# Patient Record
Sex: Female | Born: 1935 | Race: White | Hispanic: No | Smoking: Never smoker
Health system: Southern US, Community
[De-identification: ages and names within clinical notes are randomized; demographics above are authoritative.]

## PROBLEM LIST (undated history)

## (undated) DIAGNOSIS — E079 Disorder of thyroid, unspecified: Secondary | ICD-10-CM

## (undated) DIAGNOSIS — F329 Major depressive disorder, single episode, unspecified: Secondary | ICD-10-CM

## (undated) DIAGNOSIS — I509 Heart failure, unspecified: Secondary | ICD-10-CM

## (undated) DIAGNOSIS — I10 Essential (primary) hypertension: Secondary | ICD-10-CM

## (undated) DIAGNOSIS — E139 Other specified diabetes mellitus without complications: Secondary | ICD-10-CM

## (undated) DIAGNOSIS — E785 Hyperlipidemia, unspecified: Secondary | ICD-10-CM

## (undated) DIAGNOSIS — J302 Other seasonal allergic rhinitis: Secondary | ICD-10-CM

## (undated) DIAGNOSIS — K219 Gastro-esophageal reflux disease without esophagitis: Secondary | ICD-10-CM

## (undated) DIAGNOSIS — F32A Depression, unspecified: Secondary | ICD-10-CM

## (undated) DIAGNOSIS — I4891 Unspecified atrial fibrillation: Secondary | ICD-10-CM

## (undated) HISTORY — PX: BACK SURGERY: SHX140

## (undated) HISTORY — PX: KNEE ARTHROCENTESIS: SUR44

---

## 1998-10-25 ENCOUNTER — Ambulatory Visit (HOSPITAL_COMMUNITY): Admission: RE | Admit: 1998-10-25 | Discharge: 1998-10-25 | Payer: Self-pay | Admitting: Neurosurgery

## 1998-10-25 ENCOUNTER — Encounter: Payer: Self-pay | Admitting: Neurosurgery

## 1998-11-06 ENCOUNTER — Encounter: Payer: Self-pay | Admitting: Neurosurgery

## 1998-11-09 ENCOUNTER — Inpatient Hospital Stay (HOSPITAL_COMMUNITY): Admission: RE | Admit: 1998-11-09 | Discharge: 1998-11-10 | Payer: Self-pay | Admitting: Neurosurgery

## 1998-11-09 ENCOUNTER — Encounter: Payer: Self-pay | Admitting: Neurosurgery

## 2003-10-17 ENCOUNTER — Encounter: Admission: RE | Admit: 2003-10-17 | Discharge: 2003-10-17 | Payer: Self-pay

## 2004-09-18 ENCOUNTER — Encounter: Admission: RE | Admit: 2004-09-18 | Discharge: 2004-09-18 | Payer: Self-pay

## 2004-09-20 ENCOUNTER — Ambulatory Visit (HOSPITAL_COMMUNITY): Admission: RE | Admit: 2004-09-20 | Discharge: 2004-09-20 | Payer: Self-pay

## 2007-11-09 ENCOUNTER — Encounter: Admission: RE | Admit: 2007-11-09 | Discharge: 2007-11-09 | Payer: Self-pay | Admitting: Unknown Physician Specialty

## 2008-02-25 ENCOUNTER — Encounter: Admission: RE | Admit: 2008-02-25 | Discharge: 2008-02-25 | Payer: Self-pay | Admitting: Unknown Physician Specialty

## 2009-12-20 ENCOUNTER — Encounter: Admission: RE | Admit: 2009-12-20 | Discharge: 2009-12-20 | Payer: Self-pay | Admitting: Unknown Physician Specialty

## 2009-12-29 ENCOUNTER — Ambulatory Visit: Payer: Self-pay | Admitting: Family Medicine

## 2009-12-29 DIAGNOSIS — E039 Hypothyroidism, unspecified: Secondary | ICD-10-CM | POA: Insufficient documentation

## 2009-12-29 DIAGNOSIS — M545 Low back pain, unspecified: Secondary | ICD-10-CM | POA: Insufficient documentation

## 2009-12-29 DIAGNOSIS — E785 Hyperlipidemia, unspecified: Secondary | ICD-10-CM

## 2009-12-29 DIAGNOSIS — E119 Type 2 diabetes mellitus without complications: Secondary | ICD-10-CM | POA: Insufficient documentation

## 2009-12-29 DIAGNOSIS — S336XXA Sprain of sacroiliac joint, initial encounter: Secondary | ICD-10-CM | POA: Insufficient documentation

## 2009-12-29 DIAGNOSIS — I10 Essential (primary) hypertension: Secondary | ICD-10-CM

## 2009-12-29 DIAGNOSIS — M76899 Other specified enthesopathies of unspecified lower limb, excluding foot: Secondary | ICD-10-CM

## 2010-01-01 ENCOUNTER — Telehealth (INDEPENDENT_AMBULATORY_CARE_PROVIDER_SITE_OTHER): Payer: Self-pay | Admitting: *Deleted

## 2010-04-30 NOTE — Progress Notes (Signed)
  Phone Note Outgoing Call Call back at [facility] Phone [phone]   Call placed by: Lajean Saver RN,  January 01, 2010 10:09 AM Call placed to: Patient Action Taken: Phone Call Completed Summary of Call: Call back: PAtient c/o pain still present in her hip. I encouraged her to ear before taking the pain medication and to do the exercises provided. If hip pain is still present in 2 weeks to call for a referral to sports med. Survey done.

## 2010-04-30 NOTE — Assessment & Plan Note (Signed)
Summary: L hip pain x 1 wk rm 4   Vital Signs:  Patient Profile:   77 Years Old Female CC:      L Hip Pain x 1 wk Height:     61 inches O2 Sat:      99 % O2 treatment:    Room Air Temp:     97.8 degrees F oral Pulse rate:   81 / minute Pulse rhythm:   regular Resp:     16 per minute BP sitting:   144 / 79  (left arm) Cuff size:   regular  Vitals Entered By: Areta Haber CMA (December 29, 2009 11:47 AM)                  Current Allergies (reviewed today): ! CODEINE ! PENICILLIN     History of Present Illness Chief Complaint: L Hip Pain x 1 wk History of Present Illness:  Subjective:  Patient states that she fell backwards onto her back about 1.5 weeks ago.  She had no immediate pain, but over the past several days has had increasing pain in her left low back.  The pain does not radiate.  She has pain walking up/down stairs and standing from a chair, pain with walking, and supine on her left side.  No bowel or bladder dysfunction  Current Problems: TROCHANTERIC BURSITIS, LEFT (ICD-726.5) SACROILIAC STRAIN (ICD-846.9) LOW BACK PAIN, ACUTE (ICD-724.2) HYPOTHYROIDISM (ICD-244.9) HYPERTENSION (ICD-401.9) HYPERLIPIDEMIA (ICD-272.4) DIABETES MELLITUS, TYPE II (ICD-250.00)   Current Meds SIMVASTATIN 20 MG TABS (SIMVASTATIN) 1 tab by mouth once daily NARATRIPTAN HCL 2.5 MG TABS (NARATRIPTAN HCL) 2 tabs by mouth once daily NABUMETONE 500 MG TABS (NABUMETONE) 2 tabs by mouth once daily LISINOPRIL 40 MG TABS (LISINOPRIL) 1 tab by mouth once daily PRILOSEC 40 MG CPDR (OMEPRAZOLE) 1 tab by mouth once daily LEVOTHROID 100 MCG TABS (LEVOTHYROXINE SODIUM) 1 tab by mouth once daily HYDROCHLOROTHIAZIDE 25 MG TABS (HYDROCHLOROTHIAZIDE) 1 tab by mouth once daily GLIPIZIDE 10 MG XR24H-TAB (GLIPIZIDE) 1 tab by mouth once daily CENTRUM SILVER ULTRA WOMENS  TABS (MULTIPLE VITAMINS-MINERALS) 1 tab by mouth once daily CHELATED POTASSIUM 99 MG TABS (POTASSIUM) 2 tabs by mouth once  daily TYLENOL 325 MG TABS (ACETAMINOPHEN) as needed VOLTAREN 1 % GEL (DICLOFENAC SODIUM) Apply two gm  to affected areas two times a day to three times a day LORTAB 5 5-500 MG TABS (HYDROCODONE-ACETAMINOPHEN) One-half to one tab by mouth hs as needed pain  REVIEW OF SYSTEMS Constitutional Symptoms      Denies fever, chills, night sweats, weight loss, weight gain, and fatigue.  Eyes       Denies change in vision, eye pain, eye discharge, glasses, contact lenses, and eye surgery. Ear/Nose/Throat/Mouth       Denies hearing loss/aids, change in hearing, ear pain, ear discharge, dizziness, frequent runny nose, frequent nose bleeds, sinus problems, sore throat, hoarseness, and tooth pain or bleeding.  Respiratory       Denies dry cough, productive cough, wheezing, shortness of breath, asthma, bronchitis, and emphysema/COPD.  Cardiovascular       Denies murmurs, chest pain, and tires easily with exhertion.    Gastrointestinal       Denies stomach pain, nausea/vomiting, diarrhea, constipation, blood in bowel movements, and indigestion. Genitourniary       Denies painful urination, kidney stones, and loss of urinary control. Neurological       Denies paralysis, seizures, and fainting/blackouts. Musculoskeletal       Complains of decreased range  of motion.      Denies muscle pain, joint pain, joint stiffness, redness, swelling, muscle weakness, and gout.      Comments: L Hip Pain x 1 wk Skin       Denies bruising, unusual mles/lumps or sores, and hair/skin or nail changes.  Psych       Denies mood changes, temper/anger issues, anxiety/stress, speech problems, depression, and sleep problems. Other Comments: Pt states she think her BG was low on 12/19/09, fell backwards hurting her L hip x 1 wk. Pt states she was seen by PCP for R arm pain from the fall but not the hip.   Past History:  Past Medical History: Diabetes mellitus, type II Hyperlipidemia Hypertension Hypothyroidism  Past  Surgical History: Back Knee - R  Social History: Never Smoked Alcohol use-no Drug use-no Regular exercise-no Smoking Status:  never Drug Use:  no Does Patient Exercise:  no   Objective:  No acute distress  Back:  Patient moves slowly.  Tenderness over left SI joint.  Negative straight leg raise and sitting knee extension tests. Left Hip:  Good range of motion.  Tender over the left greater trochanter.  Palpation there with resisted lateral  abduction recreates some of her pain. Neuro:  Distal sensation intact.  Patellar reflexes present.  LUMBAR SPINE - COMPLETE 4+ VIEW   Comparison: Lumbar MRI dated 10/17/2003   Findings: No acute fracture identified.  Posterior element fusion hardware evident at L4-5 and L5-S1.  There is severe disc space narrowing at L3-4 with vacuum disc.  Significant leftward convex scoliosis also present.  No spondylolisthesis appreciated in the lateral view.   IMPRESSION: No acute findings.  Significant spondylosis with evidence of prior posterior element fusion at L4-5 and L5-S1. Assessment New Problems: TROCHANTERIC BURSITIS, LEFT (ICD-726.5) SACROILIAC STRAIN (ICD-846.9) LOW BACK PAIN, ACUTE (ICD-724.2) HYPOTHYROIDISM (ICD-244.9) HYPERTENSION (ICD-401.9) HYPERLIPIDEMIA (ICD-272.4) DIABETES MELLITUS, TYPE II (ICD-250.00)   Plan New Medications/Changes: LORTAB 5 5-500 MG TABS (HYDROCODONE-ACETAMINOPHEN) One-half to one tab by mouth hs as needed pain  #10 (ten) x 0, 12/29/2009, Donna Christen MD VOLTAREN 1 % GEL (DICLOFENAC SODIUM) Apply two gm  to affected areas two times a day to three times a day  #100gm x 1, 12/29/2009, Donna Christen MD  New Orders: T-DG Lumbar Spine Complete [72110] New Patient Level III [99203] Planning Comments:   Begin applying ice pack several times daily.  Apply Voltaren gel to left SI joint and left greater trochanter.  Analgesic for bedtime. Begin range of motion exercises ([employer] information and  instruction patient handout given)  Follow-up with [facility] if not improving 2 to 3 weeks.   The patient and/or caregiver has been counseled thoroughly with regard to medications prescribed including dosage, schedule, interactions, rationale for use, and possible side effects and they verbalize understanding.  Diagnoses and expected course of recovery discussed and will return if not improved as expected or if the condition worsens. Patient and/or caregiver verbalized understanding.  Prescriptions: LORTAB 5 5-500 MG TABS (HYDROCODONE-ACETAMINOPHEN) One-half to one tab by mouth hs as needed pain  #10 (ten) x 0   Entered and Authorized by:   Donna Christen MD   Signed by:   Donna Christen MD on 12/29/2009   Method used:   Print then Give to Patient   RxID:   [id number] VOLTAREN 1 % GEL (DICLOFENAC SODIUM) Apply two gm  to affected areas two times a day to three times a day  #100gm x 1  Entered and Authorized by:   Donna Christen MD   Signed by:   Donna Christen MD on 12/29/2009   Method used:   Print then Give to Patient   RxID:   [id number]   Orders Added: 1)  T-DG Lumbar Spine Complete [72110] 2)  New Patient Level III [84132]

## 2012-06-27 ENCOUNTER — Encounter: Payer: Self-pay | Admitting: Emergency Medicine

## 2012-06-27 ENCOUNTER — Emergency Department (INDEPENDENT_AMBULATORY_CARE_PROVIDER_SITE_OTHER): Payer: Medicare Other

## 2012-06-27 ENCOUNTER — Emergency Department
Admission: EM | Admit: 2012-06-27 | Discharge: 2012-06-27 | Disposition: A | Discharge status: Home / Self Care | Payer: Medicare Other | Source: Home / Self Care | Attending: Family Medicine | Admitting: Family Medicine

## 2012-06-27 DIAGNOSIS — R079 Chest pain, unspecified: Secondary | ICD-10-CM

## 2012-06-27 DIAGNOSIS — R05 Cough: Secondary | ICD-10-CM

## 2012-06-27 DIAGNOSIS — J209 Acute bronchitis, unspecified: Secondary | ICD-10-CM

## 2012-06-27 HISTORY — DX: Other specified diabetes mellitus without complications: E13.9

## 2012-06-27 HISTORY — DX: Other seasonal allergic rhinitis: J30.2

## 2012-06-27 HISTORY — DX: Hyperlipidemia, unspecified: E78.5

## 2012-06-27 HISTORY — DX: Essential (primary) hypertension: I10

## 2012-06-27 HISTORY — DX: Gastro-esophageal reflux disease without esophagitis: K21.9

## 2012-06-27 HISTORY — DX: Depression, unspecified: F32.A

## 2012-06-27 HISTORY — DX: Major depressive disorder, single episode, unspecified: F32.9

## 2012-06-27 MED ORDER — BENZONATATE 200 MG PO CAPS: 200.0000 mg | ORAL_CAPSULE | Freq: Every day | ORAL | Status: DC

## 2012-06-27 MED ORDER — DOXYCYCLINE HYCLATE 100 MG PO CAPS: 100.0000 mg | ORAL_CAPSULE | Freq: Two times a day (BID) | ORAL | Status: DC

## 2012-06-27 NOTE — ED Provider Notes (Signed)
History     CSN: [id number]  Arrival date & time 06/27/12  1230   First MD Initiated Contact with Patient 06/27/12 1321      Chief Complaint  Patient presents with  . Nasal Congestion  . Cough  . Hoarse  . Sore Throat      HPI Comments: Patient developed mild cold like symptoms about two weeks ago with sore throat, sneezing, sinus congestion and cough.  The cough is productive and has gradually become worse.  She frequently coughs until she gags. She has a past history of pneumonia, but gets pneumococcal vaccine every 5 years.  The history is provided by the patient and a relative.    Past Medical History  Diagnosis Date  . Hypertension   . Seasonal allergies   . Depression   . Hyperlipidemia   . GERD (gastroesophageal reflux disease)   . Diabetes 1.5, managed as type 2     Past Surgical History  Procedure Laterality Date  . Back surgery    . Knee arthrocentesis      History reviewed. No pertinent family history.  History  Substance Use Topics  . Smoking status: Not on file  . Smokeless tobacco: Not on file  . Alcohol Use: Not on file    OB History   Grav Para Term Preterm Abortions TAB SAB Ect Mult Living                  Review of Systems + sore throat, resolved + cough + pleuritic pain + wheezing + nasal congestion + post-nasal drainage ? sinus pain/pressure No itchy/red eyes No earache No hemoptysis + SOB No fever/chills No nausea No vomiting No abdominal pain No diarrhea No urinary symptoms No skin rashes + fatigue No myalgias No headache Used OTC meds without relief  Allergies  Codeine and Penicillins  Home Medications   Current Outpatient Rx  Name  Route  Sig  Dispense  Refill  . glipiZIDE (GLUCOTROL XL) 10 MG 24 hr tablet   Oral   Take 20 mg by mouth daily.         . hydrochlorothiazide (HYDRODIURIL) 25 MG tablet   Oral   Take 25 mg by mouth daily.         Marland Kitchen levothyroxine (SYNTHROID, LEVOTHROID) 75 MCG tablet  Oral   Take 75 mcg by mouth daily.         Marland Kitchen lisinopril (PRINIVIL,ZESTRIL) 40 MG tablet   Oral   Take 40 mg by mouth daily.         . nabumetone (RELAFEN) 500 MG tablet   Oral   Take 500 mg by mouth 2 (two) times daily.         . nortriptyline (PAMELOR) 25 MG capsule   Oral   Take 25 mg by mouth 2 (two) times daily.         Marland Kitchen omeprazole (PRILOSEC) 40 MG capsule   Oral   Take 40 mg by mouth daily.         . simvastatin (ZOCOR) 20 MG tablet   Oral   Take 20 mg by mouth every evening.         . benzonatate (TESSALON) 200 MG capsule   Oral   Take 1 capsule (200 mg total) by mouth at bedtime.   12 capsule   0   . doxycycline (VIBRAMYCIN) 100 MG capsule   Oral   Take 1 capsule (100 mg total) by mouth 2 (two) times daily.  20 capsule   0     BP 148/82  Pulse 78  Temp(Src) 98.1 F (36.7 C) (Oral)  Ht 5\' 1"  (1.549 m)  Wt 165 lb (74.844 kg)  BMI 31.19 kg/m2  SpO2 96%  Physical Exam Nursing notes and Vital Signs reviewed. Appearance:  Patient appears stated age, and in no acute distress.  She is alert and oriented. Eyes:  Pupils are equal, round, and reactive to light and accomodation.  Extraocular movement is intact.  Conjunctivae are not inflamed  Ears:  Canals normal.  Tympanic membranes normal.  Nose:  Mildly congested turbinates.  No sinus tenderness.   Pharynx:  Normal Neck:  Supple.  No adenopathy Lungs:  Clear to auscultation.  Breath sounds are equal.  Chest:  Mild tenderness over left lateral/inferior chest. Heart:  Regular rate and rhythm without murmurs, rubs, or gallops.  Abdomen:  Nontender without masses or hepatosplenomegaly.  Bowel sounds are present.  No CVA or flank tenderness.  Extremities:  No edema.  No calf tenderness Skin:  No rash present.   ED Course  Procedures  none   Dg Ribs Unilateral W/chest Left  06/27/2012  *RADIOLOGY REPORT*  Clinical Data: Left lateral rib pain after 2 weeks of coughing.  LEFT RIBS AND CHEST - 3+  VIEW  Comparison: 02/25/2008.  Findings: Normal sized heart.  Clear lungs.  Stable mild central peribronchial thickening.  No rib fracture or pneumothorax seen.  IMPRESSION:  1.  No rib fracture or pneumothorax. 2.  Stable mild chronic bronchitic changes.   Original Report Authenticated By: Beckie Salts, M.D.      1. Acute bronchitis       MDM  Begin doxycycline. Prescription written for Benzonatate [facility]) to take at bedtime for night-time cough.  Take plain Mucinex (guaifenesin) twice daily for cough and congestion.  Increase fluid intake, rest. Stop all antihistamines for now, and other non-prescription cough/cold preparations. Follow-up with family doctor if not improving 7 to 10 days.        Lattie Haw, MD 06/28/12 [phone]

## 2012-06-27 NOTE — ED Notes (Signed)
Reports going to PCP 4 days ago for congestion, aches, cough and hoarseness; was determined to have sinus issues. Feeling worse since then. Did have Flu vaccination this season.

## 2012-06-29 ENCOUNTER — Telehealth: Payer: Self-pay | Admitting: *Deleted

## 2012-12-21 DIAGNOSIS — E119 Type 2 diabetes mellitus without complications: Secondary | ICD-10-CM | POA: Insufficient documentation

## 2013-08-17 DIAGNOSIS — S92309A Fracture of unspecified metatarsal bone(s), unspecified foot, initial encounter for closed fracture: Secondary | ICD-10-CM | POA: Insufficient documentation

## 2013-08-17 DIAGNOSIS — R55 Syncope and collapse: Secondary | ICD-10-CM | POA: Insufficient documentation

## 2013-08-17 DIAGNOSIS — S8253XA Displaced fracture of medial malleolus of unspecified tibia, initial encounter for closed fracture: Secondary | ICD-10-CM | POA: Insufficient documentation

## 2013-12-22 ENCOUNTER — Emergency Department
Admission: EM | Admit: 2013-12-22 | Discharge: 2013-12-22 | Disposition: A | Discharge status: Home / Self Care | Payer: Medicare Other | Source: Home / Self Care | Attending: Emergency Medicine | Admitting: Emergency Medicine

## 2013-12-22 ENCOUNTER — Encounter: Payer: Self-pay | Admitting: Emergency Medicine

## 2013-12-22 ENCOUNTER — Emergency Department (INDEPENDENT_AMBULATORY_CARE_PROVIDER_SITE_OTHER): Payer: Medicare Other

## 2013-12-22 DIAGNOSIS — M1610 Unilateral primary osteoarthritis, unspecified hip: Secondary | ICD-10-CM

## 2013-12-22 DIAGNOSIS — M25559 Pain in unspecified hip: Secondary | ICD-10-CM

## 2013-12-22 DIAGNOSIS — M1611 Unilateral primary osteoarthritis, right hip: Secondary | ICD-10-CM

## 2013-12-22 MED ORDER — HYDROCODONE-ACETAMINOPHEN 5-325 MG PO TABS: ORAL_TABLET | ORAL | Status: DC

## 2013-12-22 NOTE — ED Provider Notes (Signed)
CSN: [id number]     Arrival date & time 12/22/13  0910 History   First MD Initiated Contact with Patient 12/22/13 [phone]     Chief Complaint  Patient presents with  . Leg Pain   (Consider location/radiation/quality/duration/timing/severity/associated sxs/prior Treatment) Patient is a 80 y.o. female presenting with leg pain. The history is provided by the patient. No language interpreter was used.  Leg Pain Location:  Hip Time since incident:  3 months Hip location:  R hip Pain details:    Quality:  Aching   Radiates to:  Groin   Severity:  Moderate   Onset quality:  Gradual   Duration:  3 months   Timing:  Constant   Progression:  Worsening Chronicity:  New Dislocation: no   Foreign body present:  No foreign bodies Prior injury to area:  No Relieved by:  Arthritis medications Ineffective treatments:  None tried Associated symptoms: back pain   Risk factors: no concern for non-accidental trauma   Pt had a foot fracture and was in a wheel chair until she healed.  Pt reports once she began walking again in July she has been having right hip pain.  Pt wants to go to [place] this weekend and is looking for pain relief so she can go.   No falls,   Past Medical History  Diagnosis Date  . Hypertension   . Seasonal allergies   . Depression   . Hyperlipidemia   . GERD (gastroesophageal reflux disease)   . Diabetes 1.5, managed as type 2    Past Surgical History  Procedure Laterality Date  . Back surgery    . Knee arthrocentesis     History reviewed. No pertinent family history. History  Substance Use Topics  . Smoking status: Never Smoker   . Smokeless tobacco: Not on file  . Alcohol Use: No   OB History   Grav Para Term Preterm Abortions TAB SAB Ect Mult Living                 Review of Systems  Musculoskeletal: Positive for back pain.  All other systems reviewed and are negative.   Allergies  Codeine and Penicillins  Home Medications   Prior to Admission  medications   Medication Sig Start Date End Date Taking? Authorizing Provider  benzonatate (TESSALON) 200 MG capsule Take 1 capsule (200 mg total) by mouth at bedtime. 06/27/12   Lattie Haw, MD  doxycycline (VIBRAMYCIN) 100 MG capsule Take 1 capsule (100 mg total) by mouth 2 (two) times daily. 06/27/12   Lattie Haw, MD  glipiZIDE (GLUCOTROL XL) 10 MG 24 hr tablet Take 20 mg by mouth daily.    Historical Provider, MD  hydrochlorothiazide (HYDRODIURIL) 25 MG tablet Take 25 mg by mouth daily.    Historical Provider, MD  levothyroxine (SYNTHROID, LEVOTHROID) 75 MCG tablet Take 75 mcg by mouth daily.    Historical Provider, MD  lisinopril (PRINIVIL,ZESTRIL) 40 MG tablet Take 40 mg by mouth daily.    Historical Provider, MD  nabumetone (RELAFEN) 500 MG tablet Take 500 mg by mouth 2 (two) times daily.    Historical Provider, MD  nortriptyline (PAMELOR) 25 MG capsule Take 25 mg by mouth 2 (two) times daily.    Historical Provider, MD  omeprazole (PRILOSEC) 40 MG capsule Take 40 mg by mouth daily.    Historical Provider, MD  simvastatin (ZOCOR) 20 MG tablet Take 20 mg by mouth every evening.    Historical Provider, MD   BP  150/87  Pulse 91  Temp(Src) 97.8 F (36.6 C) (Oral)  Resp 18  Ht  (1.549 m)  Wt 160 lb (72.576 kg)  BMI 30.25 kg/m2  SpO2 98% Physical Exam  Nursing note and vitals reviewed. Constitutional: She is oriented to person, place, and time. She appears well-developed and well-nourished.  HENT:  Head: Normocephalic.  Eyes: EOM are normal. Pupils are equal, round, and reactive to light.  Neck: Normal range of motion.  Abdominal: She exhibits no distension.  Musculoskeletal:  Tender right hip and right groin,  Pain with range of motion  nv and ns intact  Neurological: She is alert and oriented to person, place, and time.  Psychiatric: She has a normal mood and affect.    ED Course  Procedures (including critical care time) Labs Review Labs Reviewed - No data to  display  Imaging Review Dg Hip Complete Right  12/22/2013   CLINICAL DATA:  One week history of pain  EXAM: RIGHT HIP - COMPLETE 2+ VIEW  COMPARISON:  None.  FINDINGS: Frontal pelvis as well as frontal and lateral images of the right hip were obtained. There is moderate narrowing of both hip joints. No fracture or dislocation. No erosive change. There is postoperative change in the lower lumbar spine. There is lower lumbar levoscoliosis.  IMPRESSION: Areas of arthropathic change.  No fracture or dislocation.   Electronically Signed   By: Bretta Bang M.D.   On: 12/22/2013 09:54     MDM   Pt is on arthritis medication.   Pt reports she as done well with vicodin.   I referred pt to Dr. Karie Schwalbe.   She may benefit from physical therapy   1. Arthritis of right hip    RX for hydrocodne   Elson Areas, PA-C 12/22/13 1029

## 2013-12-22 NOTE — Discharge Instructions (Signed)
Arthritis, Nonspecific °Arthritis is pain, redness, warmth, or puffiness (inflammation) of a joint. The joint may be stiff or hurt when you move it. One or more joints may be affected. There are many types of arthritis. Your doctor may not know what type you have right away. The most common cause of arthritis is wear and tear on the joint (osteoarthritis). °HOME CARE  °· Only take medicine as told by your doctor. °· Rest the joint as much as possible. °· Raise (elevate) your joint if it is puffy. °· Use crutches if the painful joint is in your leg. °· Drink enough fluids to keep your pee (urine) clear or pale yellow. °· Follow your doctor's diet instructions. °· Use cold packs for very bad joint pain for 10 to 15 minutes every hour. Ask your doctor if it is okay for you to use hot packs. °· Exercise as told by your doctor. °· Take a warm shower if you have stiffness in the morning. °· Move your sore joints throughout the day. °GET HELP RIGHT AWAY IF:  °· You have a fever. °· You have very bad joint pain, puffiness, or redness. °· You have many joints that are painful and puffy. °· You are not getting better with treatment. °· You have very bad back pain or leg weakness. °· You cannot control when you poop (bowel movement) or pee (urinate). °· You do not feel better in 24 hours or are getting worse. °· You are having side effects from your medicine. °MAKE SURE YOU:  °· Understand these instructions. °· Will watch your condition. °· Will get help right away if you are not doing well or get worse. °Document Released: 06/11/2009 Document Revised: 09/16/2011 Document Reviewed: 06/11/2009 °[employer]® Patient Information ©2015 [employer]. This information is not intended to replace advice given to you by your health care provider. Make sure you discuss any questions you have with your health care provider. ° °

## 2013-12-22 NOTE — ED Notes (Signed)
Reports pain in right groin that shoots down leg upon weight bearing; this has been going [facility]; she was in w/c until her discharge. Taking ibuprofen prn without much success.

## 2013-12-23 NOTE — ED Provider Notes (Signed)
Medical history/examination/treatment/procedure(s) were performed by non-physician provider and as supervising physician I was immediately available for consultation/collaboration.   David Massey, MD 12/23/13 1113 

## 2013-12-24 ENCOUNTER — Telehealth: Payer: Self-pay | Admitting: Emergency Medicine

## 2013-12-24 NOTE — ED Notes (Signed)
Inquired about patient's status; encourage them to call with questions/concerns.  

## 2014-01-10 ENCOUNTER — Institutional Professional Consult (permissible substitution): Payer: Medicare Other | Admitting: Sports Medicine

## 2014-01-12 ENCOUNTER — Ambulatory Visit (INDEPENDENT_AMBULATORY_CARE_PROVIDER_SITE_OTHER): Payer: Medicare Other | Admitting: Sports Medicine

## 2014-01-12 ENCOUNTER — Encounter: Payer: Self-pay | Admitting: Sports Medicine

## 2014-01-12 VITALS — BP 172/98 | HR 85 | Ht 61.0 in | Wt 161.0 lb

## 2014-01-12 DIAGNOSIS — I10 Essential (primary) hypertension: Secondary | ICD-10-CM

## 2014-01-12 DIAGNOSIS — E119 Type 2 diabetes mellitus without complications: Secondary | ICD-10-CM

## 2014-01-12 DIAGNOSIS — E039 Hypothyroidism, unspecified: Secondary | ICD-10-CM

## 2014-01-12 DIAGNOSIS — M1611 Unilateral primary osteoarthritis, right hip: Secondary | ICD-10-CM | POA: Insufficient documentation

## 2014-01-12 DIAGNOSIS — I8312 Varicose veins of left lower extremity with inflammation: Secondary | ICD-10-CM

## 2014-01-12 DIAGNOSIS — I872 Venous insufficiency (chronic) (peripheral): Secondary | ICD-10-CM | POA: Insufficient documentation

## 2014-01-12 DIAGNOSIS — I8311 Varicose veins of right lower extremity with inflammation: Secondary | ICD-10-CM

## 2014-01-12 MED ORDER — AMBULATORY NON FORMULARY MEDICATION: Status: DC

## 2014-01-12 MED ORDER — MELOXICAM 15 MG PO TABS: ORAL_TABLET | ORAL | Status: AC

## 2014-01-12 NOTE — Assessment & Plan Note (Signed)
Injection, formal physical therapy. Mobic. Return in 4 weeks.

## 2014-01-12 NOTE — Progress Notes (Signed)
   Subjective:    I'm seeing this patient as a consultation for:  Mary MaskerKaren Sofia, PA-C and Loleta DickerErin Judge, NP (PCP)  CC: Right hip pain  HPI: This is an exquisitely pleasant 80 year old female who comes in with a long history of right hip pain that she localizes in the groin, moderate, persistent, worse in the mornings. It radiates into the anterior thigh, over-the-counter analgesics have been ineffective. She has never had injection therapy, and has never done physical therapy.  She also has some complaints about discoloration and pain along her mid shins bilaterally. She was seen by a podiatrist who had asked her to visit an M.D.  She has multiple other joint complaints that she is amenable to discussing at future visit. She likely has contralateral hip osteoarthritis, and x-rays do show multilevel lumbar spondylosis. We will treat each of her joints one at a time.  Past medical history, Surgical history, Family history not pertinant except as noted below, Social history, Allergies, and medications have been entered into the medical record, reviewed, and no changes needed.   Review of Systems: No headache, visual changes, nausea, vomiting, diarrhea, constipation, dizziness, abdominal pain, skin rash, fevers, chills, night sweats, weight loss, swollen lymph nodes, body aches, joint swelling, muscle aches, chest pain, shortness of breath, mood changes, visual or auditory hallucinations.   Objective:   General: Well Developed, well nourished, and in no acute distress.  Neuro/Psych: Alert and oriented x3, extra-ocular muscles intact, able to move all 4 extremities, sensation grossly intact. Skin: Warm and dry, no rashes noted.  Respiratory: Not using accessory muscles, speaking in full sentences, trachea midline.  Cardiovascular: Pulses palpable, no extremity edema. Abdomen: Does not appear distended. Right Hip: ROM IR: 60 Deg, ER: 60 Deg, Flexion: 120 Deg, Extension: 100 Deg, Abduction: 45 Deg,  Adduction: 45 Deg significant pain with internal rotation. Strength IR: 5/5, ER: 5/5, Flexion: 5/5, Extension: 5/5, Abduction: 5/5, Adduction: 5/5 Pelvic alignment unremarkable to inspection and palpation. Standing hip rotation and gait without trendelenburg / unsteadiness. Greater trochanter without tenderness to palpation. No tenderness over piriformis. No SI joint tenderness and normal minimal SI movement. Bilateral legs: There are hyperpigmented changes , and hemosiderin deposits around both lower shins in a distribution suggestive of bilateral venous stasis dermatitis.  Hip x-rays reviewed and do show osteoarthritis.  Procedure: Real-time Ultrasound Guided Injection of right femoral acetabular joint Device: GE Logiq E  Verbal informed consent obtained.  Time-out conducted.  Noted no overlying erythema, induration, or other signs of local infection.  Skin prepped in a sterile fashion.  Local anesthesia: Topical Ethyl chloride.  With sterile technique and under real time ultrasound guidance:  Spinal needle advanced to the femoral head/neck junction, 2 cc kenalog 40, 4 cc lidocaine injected easily. Completed without difficulty  Pain immediately resolved suggesting accurate placement of the medication.  Advised to call if fevers/chills, erythema, induration, drainage, or persistent bleeding.  Images permanently stored and available for review in the ultrasound unit.  Impression: Technically successful ultrasound guided injection.  Impression and Recommendations:   This case required medical decision making of moderate complexity.

## 2014-01-12 NOTE — Assessment & Plan Note (Signed)
Lower extremity compression hose.

## 2014-01-31 ENCOUNTER — Encounter: Payer: Self-pay | Admitting: Sports Medicine

## 2014-01-31 ENCOUNTER — Ambulatory Visit (INDEPENDENT_AMBULATORY_CARE_PROVIDER_SITE_OTHER): Payer: Medicare Other | Admitting: Sports Medicine

## 2014-01-31 VITALS — BP 143/86 | HR 59 | Wt 159.0 lb

## 2014-01-31 DIAGNOSIS — M7062 Trochanteric bursitis, left hip: Secondary | ICD-10-CM

## 2014-01-31 DIAGNOSIS — M1611 Unilateral primary osteoarthritis, right hip: Secondary | ICD-10-CM

## 2014-01-31 DIAGNOSIS — M7061 Trochanteric bursitis, right hip: Secondary | ICD-10-CM | POA: Insufficient documentation

## 2014-01-31 NOTE — Assessment & Plan Note (Signed)
Hip joint is now pain-free after injection.

## 2014-01-31 NOTE — Progress Notes (Signed)
  Subjective:    CC: follow-up  HPI: Right hip pain: Mary Travis returns, at the last visit we injected her femoral acetabular joint. She no longer has pain referable to the hip joint but now has pain on the lateral aspect of the hip over the greater trochanter with radiation down the lateral thigh but not past the knee. Worse with weightbearing and lying on the ipsilateral side, moderate, persistent.  she has not yet done physical therapy.  Past medical history, Surgical history, Family history not pertinant except as noted below, Social history, Allergies, and medications have been entered into the medical record, reviewed, and no changes needed.   Review of Systems: No fevers, chills, night sweats, weight loss, chest pain, or shortness of breath.   Objective:    General: Well Developed, well nourished, and in no acute distress.  Neuro: Alert and oriented x3, extra-ocular muscles intact, sensation grossly intact.  HEENT: Normocephalic, atraumatic, pupils equal round reactive to light, neck supple, no masses, no lymphadenopathy, thyroid nonpalpable.  Skin: Warm and dry, no rashes. Cardiac: Regular rate and rhythm, no murmurs rubs or gallops, no lower extremity edema.  Respiratory: Clear to auscultation bilaterally. Not using accessory muscles, speaking in full sentences. Right Hip: ROM IR: 60 Deg, ER: 60 Deg, Flexion: 120 Deg, Extension: 100 Deg, Abduction: 45 Deg, Adduction: 45 Deg, no pain with internal rotation. Strength IR: 5/5, ER: 5/5, Flexion: 5/5, Extension: 5/5, Abduction: 4-/5, Adduction: 5/5 Pelvic alignment unremarkable to inspection and palpation. Standing hip rotation and gait without trendelenburg / unsteadiness. Greater trochanter with severe tenderness to palpation. No tenderness over piriformis. No SI joint tenderness and normal minimal SI movement.  Procedure:  Injection of right trochanteric bursa Consent obtained and verified. Time-out conducted. Noted no overlying  erythema, induration, or other signs of local infection. Skin prepped in a sterile fashion. Topical analgesic spray: Ethyl chloride. Completed without difficulty. Meds:spinal needle advanced to the greater trochanter, withdrawn slightly and 1 mL kenalog 40, 4 mL lidocaine injected in a fanlike pattern. Pain immediately improved suggesting accurate placement of the medication. Advised to call if fevers/chills, erythema, induration, drainage, or persistent bleeding.  Impression and Recommendations:

## 2014-01-31 NOTE — Assessment & Plan Note (Signed)
Good response to femoral acetabular joint injection 3 weeks ago. Hip joint is now pain-free, pain is not referable to the greater trochanter.  Greater trochanteric bursa injection as above. Formal physical therapy. Return in one month.

## 2014-02-08 ENCOUNTER — Ambulatory Visit: Payer: Medicare Other | Admitting: Physical Therapy

## 2014-02-10 ENCOUNTER — Ambulatory Visit: Payer: Medicare Other | Admitting: Sports Medicine

## 2014-02-20 ENCOUNTER — Encounter: Payer: Self-pay | Admitting: Sports Medicine

## 2014-02-20 ENCOUNTER — Ambulatory Visit (INDEPENDENT_AMBULATORY_CARE_PROVIDER_SITE_OTHER): Payer: Medicare Other | Admitting: Sports Medicine

## 2014-02-20 VITALS — BP 180/87 | HR 123 | Wt 159.0 lb

## 2014-02-20 DIAGNOSIS — M5136 Other intervertebral disc degeneration, lumbar region: Secondary | ICD-10-CM

## 2014-02-20 DIAGNOSIS — M51369 Other intervertebral disc degeneration, lumbar region without mention of lumbar back pain or lower extremity pain: Secondary | ICD-10-CM | POA: Insufficient documentation

## 2014-02-20 MED ORDER — PREDNISONE 50 MG PO TABS: ORAL_TABLET | ORAL | Status: DC

## 2014-02-20 MED ORDER — GABAPENTIN 300 MG PO CAPS: ORAL_CAPSULE | ORAL | Status: DC

## 2014-02-20 MED ORDER — LIDOCAINE 5 % EX OINT: 1.0000 "application " | TOPICAL_OINTMENT | Freq: Three times a day (TID) | CUTANEOUS | Status: DC | PRN

## 2014-02-20 NOTE — Assessment & Plan Note (Signed)
As lumbar fusion, unclear as to what level. Hip osteoarthritis and trochanteric bursitis symptoms have resolved, she continues to have right-sided S1 radicular symptoms. We are going to obtain a lumbar spine MRI with IV contrast considering prior surgery. We are attempting to obtain a prior BUN/creatinine creatinine. Topical lidocaine, prednisone, gabapentin. Return to see me to go over MRI results.

## 2014-02-20 NOTE — Progress Notes (Signed)
  Subjective:    CC: followup.  HPI: Right hip osteoarthritis:  Resolved after injection.  Right trochanteric bursitis: Resolved after injection.  Right thigh pain: With radiation down to the foot, moderate, persistent in a right-sided S1 distribution, she is post lumbar fusion in the distant past.  Past medical history, Surgical history, Family history not pertinant except as noted below, Social history, Allergies, and medications have been entered into the medical record, reviewed, and no changes needed.   Review of Systems: No fevers, chills, night sweats, weight loss, chest pain, or shortness of breath.   Objective:    General: Well Developed, well nourished, and in no acute distress.  Neuro: Alert and oriented x3, extra-ocular muscles intact, sensation grossly intact.  HEENT: Normocephalic, atraumatic, pupils equal round reactive to light, neck supple, no masses, no lymphadenopathy, thyroid nonpalpable.  Skin: Warm and dry, no rashes. Cardiac: Regular rate and rhythm, no murmurs rubs or gallops, no lower extremity edema.  Respiratory: Clear to auscultation bilaterally. Not using accessory muscles, speaking in full sentences. Right Hip: ROM IR: 60 Deg, ER: 60 Deg, Flexion: 120 Deg, Extension: 100 Deg, Abduction: 45 Deg, Adduction: 45 Deg Strength IR: 5/5, ER: 5/5, Flexion: 5/5, Extension: 5/5, Abduction: 5/5, Adduction: 5/5 Pelvic alignment unremarkable to inspection and palpation. Standing hip rotation and gait without trendelenburg / unsteadiness. Greater trochanter without tenderness to palpation. No tenderness over piriformis. No SI joint tenderness and normal minimal SI movement.  Impression and Recommendations:

## 2014-02-21 ENCOUNTER — Telehealth: Payer: Self-pay | Admitting: *Deleted

## 2014-02-21 LAB — BASIC METABOLIC PANEL WITH GFR
BUN: 22 mg/dL (ref 6–23)
Calcium: 9 mg/dL (ref 8.4–10.5)
Chloride: 95 meq/L — ABNORMAL LOW (ref 96–112)
Creat: 0.62 mg/dL (ref 0.50–1.10)
Glucose, Bld: 204 mg/dL — ABNORMAL HIGH (ref 70–99)
Potassium: 4.5 meq/L (ref 3.5–5.3)

## 2014-02-21 LAB — BASIC METABOLIC PANEL
CO2: 30 mEq/L (ref 19–32)
Sodium: 134 mEq/L — ABNORMAL LOW (ref 135–145)

## 2014-02-21 NOTE — Telephone Encounter (Signed)
MRI approval lumbar expires 04/07/14. [id number]. Radiology notified. Corliss SkainsJamie Danita Proud, CMA

## 2014-02-25 ENCOUNTER — Ambulatory Visit (HOSPITAL_BASED_OUTPATIENT_CLINIC_OR_DEPARTMENT_OTHER)
Admission: RE | Admit: 2014-02-25 | Discharge: 2014-02-25 | Disposition: A | Discharge status: Home / Self Care | Payer: Medicare Other | Source: Ambulatory Visit | Attending: Sports Medicine | Admitting: Sports Medicine

## 2014-02-25 DIAGNOSIS — M5136 Other intervertebral disc degeneration, lumbar region: Secondary | ICD-10-CM | POA: Diagnosis not present

## 2014-02-25 DIAGNOSIS — M419 Scoliosis, unspecified: Secondary | ICD-10-CM | POA: Diagnosis not present

## 2014-02-25 MED ORDER — GADOBENATE DIMEGLUMINE 529 MG/ML IV SOLN: 15.0000 mL | Freq: Once | INTRAVENOUS | Status: AC | PRN

## 2014-02-27 ENCOUNTER — Telehealth: Payer: Self-pay

## 2014-02-27 NOTE — Telephone Encounter (Signed)
Patient son called stated that patient is having extreme leg pain and she cannot move them, also she is incontinent . He would like a Rx for a pain medication. Patient do have a scheduled appt for this Wednesday. Rhonda Cunningham,CMA

## 2014-02-27 NOTE — Telephone Encounter (Signed)
If she is having new onset decrease in neurologic function and new onset incontinence I do need to see her back sooner rather than later, have her come to see me tomorrow morning. There doesn't appear to be any cause of a cauda equina syndrome on her MRI.

## 2014-03-01 ENCOUNTER — Ambulatory Visit (INDEPENDENT_AMBULATORY_CARE_PROVIDER_SITE_OTHER): Payer: Medicare Other | Admitting: Sports Medicine

## 2014-03-01 ENCOUNTER — Encounter: Payer: Self-pay | Admitting: Sports Medicine

## 2014-03-01 VITALS — BP 180/95 | HR 100 | Ht 61.0 in

## 2014-03-01 DIAGNOSIS — M5136 Other intervertebral disc degeneration, lumbar region: Secondary | ICD-10-CM

## 2014-03-01 DIAGNOSIS — M51369 Other intervertebral disc degeneration, lumbar region without mention of lumbar back pain or lower extremity pain: Secondary | ICD-10-CM

## 2014-03-01 MED ORDER — HYDROCODONE-ACETAMINOPHEN 7.5-325 MG PO TABS: 1.0000 | ORAL_TABLET | Freq: Three times a day (TID) | ORAL | Status: DC | PRN

## 2014-03-01 NOTE — Progress Notes (Signed)
  Subjective:    CC: follow-up MRI results  HPI: This is a pleasant 80 year old female with known severe lumbar scoliosis with lumbar degenerative disc disease and spondylosis. She comes in with persistent right-sided lumbar radiculopathy with pain, numbness and tingling over the anterior thigh and radiating down to the lateral lower leg. Pain is severe, it was improved significantly while on prednisone. Gabapentin has not been effective.    Past medical history, Surgical history, Family history not pertinant except as noted below, Social history, Allergies, and medications have been entered into the medical record, reviewed, and no changes needed.   Review of Systems: No fevers, chills, night sweats, weight loss, chest pain, or shortness of breath.   Objective:    General: Well Developed, well nourished, and in no acute distress.  Neuro: Alert and oriented x3, extra-ocular muscles intact, sensation grossly intact.  HEENT: Normocephalic, atraumatic, pupils equal round reactive to light, neck supple, no masses, no lymphadenopathy, thyroid nonpalpable.  Skin: Warm and dry, no rashes. Cardiac: Regular rate and rhythm, no murmurs rubs or gallops, no lower extremity edema.  Respiratory: Clear to auscultation bilaterally. Not using accessory muscles, speaking in full sentences.  MRI shows multilevel lumbar spondylosis and degenerative disc disease with severe scoliosis, and lateral listhesis of several vertebrae.  Impression and Recommendations:

## 2014-03-01 NOTE — Telephone Encounter (Signed)
Patient son sated that he will keep appt scheduled for 03/01/2014. Aireonna Bauer,CMA

## 2014-03-01 NOTE — Assessment & Plan Note (Addendum)
Low back pain with right sided mid lumbar radiculopathy. MRI does show multilevel lumbar degenerative disc disease and spondylosis with severe scoliosis. Clinically this represents a right-sided L3 radiculopathy, we are going to proceed with a right-sided L3-L4 interlaminar epidural injection. Return to see me 3 weeks after the injection to evaluate response, if no improvement, we will proceed with multilevel facet joint injections.  She does also have great difficulty with ambulation and would benefit from a manual wheelchair. Patient has mobility limitations that significantly impair her ability to do all of her mobility-related activities of daily living and customary locations in her home, she is unable to use a cane, crutch, or walker due to lower extremity weakness, and she is able to safely propel a wheelchair in her home, she has unfortunately not able to push yourself and heavier standard wheelchair however she can be able to propel herself in a light weight wheelchair and has sufficient endurance for this.

## 2014-03-02 ENCOUNTER — Ambulatory Visit: Payer: Medicare Other | Admitting: Sports Medicine

## 2014-03-02 DIAGNOSIS — I480 Paroxysmal atrial fibrillation: Secondary | ICD-10-CM | POA: Insufficient documentation

## 2014-03-03 ENCOUNTER — Ambulatory Visit: Payer: Medicare Other | Admitting: Sports Medicine

## 2014-03-03 DIAGNOSIS — Z0289 Encounter for other administrative examinations: Secondary | ICD-10-CM

## 2014-03-06 ENCOUNTER — Other Ambulatory Visit: Payer: Medicare Other

## 2014-03-15 ENCOUNTER — Ambulatory Visit
Admission: RE | Admit: 2014-03-15 | Discharge: 2014-03-15 | Disposition: A | Discharge status: Home / Self Care | Payer: Medicare Other | Source: Ambulatory Visit | Attending: Sports Medicine | Admitting: Sports Medicine

## 2014-03-15 MED ORDER — METHYLPREDNISOLONE ACETATE 40 MG/ML INJ SUSP (RADIOLOG
120.0000 mg | Freq: Once | INTRAMUSCULAR | Status: AC
  Administered 2014-03-15: 120 mg via EPIDURAL

## 2014-03-15 MED ORDER — IOHEXOL 180 MG/ML  SOLN: 1.0000 mL | Freq: Once | INTRAMUSCULAR | Status: AC | PRN

## 2014-03-15 MED ORDER — IOHEXOL 180 MG/ML  SOLN
1.0000 mL | Freq: Once | INTRAMUSCULAR | Status: AC | PRN
  Administered 2014-03-15: 1 mL via INTRAVENOUS

## 2014-03-15 NOTE — Discharge Instructions (Signed)

## 2014-04-07 ENCOUNTER — Telehealth: Payer: Self-pay | Admitting: *Deleted

## 2014-04-07 NOTE — Telephone Encounter (Signed)
Mary [facility]vin[facility]oya called from [facility] and they would like you to put an order in for a wheelchair for Mary Travis.

## 2014-04-10 MED ORDER — AMBULATORY NON FORMULARY MEDICATION: Status: DC

## 2014-04-10 NOTE — Telephone Encounter (Signed)
Orders placed for motorized scooter, rx in box.

## 2014-04-13 ENCOUNTER — Telehealth: Payer: Self-pay

## 2014-04-13 ENCOUNTER — Other Ambulatory Visit: Payer: Self-pay

## 2014-04-13 DIAGNOSIS — I429 Cardiomyopathy, unspecified: Secondary | ICD-10-CM | POA: Diagnosis not present

## 2014-04-13 DIAGNOSIS — I480 Paroxysmal atrial fibrillation: Secondary | ICD-10-CM | POA: Diagnosis not present

## 2014-04-13 DIAGNOSIS — E119 Type 2 diabetes mellitus without complications: Secondary | ICD-10-CM | POA: Diagnosis not present

## 2014-04-13 DIAGNOSIS — I10 Essential (primary) hypertension: Secondary | ICD-10-CM | POA: Diagnosis not present

## 2014-04-13 MED ORDER — AMBULATORY NON FORMULARY MEDICATION: Status: DC

## 2014-04-13 NOTE — Telephone Encounter (Signed)
Should be good

## 2014-04-13 NOTE — Telephone Encounter (Signed)
Wheel Chair order will be faxed to [facility]/Fax - [phone] after office note is finished.

## 2014-04-13 NOTE — Telephone Encounter (Signed)
Shaylea needs a regular wheel chair.

## 2014-04-13 NOTE — Telephone Encounter (Signed)
Error. Rhonda Cunningham,CMA  

## 2014-04-14 DIAGNOSIS — Z79899 Other long term (current) drug therapy: Secondary | ICD-10-CM | POA: Diagnosis not present

## 2014-04-14 DIAGNOSIS — Z1211 Encounter for screening for malignant neoplasm of colon: Secondary | ICD-10-CM | POA: Diagnosis not present

## 2014-04-14 NOTE — Telephone Encounter (Signed)
Faxed order to [facility] °

## 2014-04-14 NOTE — Addendum Note (Signed)
Addended by: Monica BectonHEKKEKANDAM, THOMAS J on: 04/14/2014 05:22 PM   Modules accepted: Level of Service

## 2014-04-26 ENCOUNTER — Emergency Department (INDEPENDENT_AMBULATORY_CARE_PROVIDER_SITE_OTHER)
Admission: EM | Admit: 2014-04-26 | Discharge: 2014-04-26 | Disposition: A | Discharge status: Other Acute Inpatient Hospital | Payer: Self-pay | Source: Home / Self Care

## 2014-04-26 ENCOUNTER — Encounter: Payer: Self-pay | Admitting: *Deleted

## 2014-04-26 DIAGNOSIS — Z885 Allergy status to narcotic agent status: Secondary | ICD-10-CM | POA: Diagnosis not present

## 2014-04-26 DIAGNOSIS — J323 Chronic sphenoidal sinusitis: Secondary | ICD-10-CM | POA: Diagnosis not present

## 2014-04-26 DIAGNOSIS — I482 Chronic atrial fibrillation, unspecified: Secondary | ICD-10-CM

## 2014-04-26 DIAGNOSIS — H538 Other visual disturbances: Secondary | ICD-10-CM | POA: Diagnosis not present

## 2014-04-26 DIAGNOSIS — R030 Elevated blood-pressure reading, without diagnosis of hypertension: Secondary | ICD-10-CM | POA: Diagnosis not present

## 2014-04-26 DIAGNOSIS — R531 Weakness: Secondary | ICD-10-CM | POA: Diagnosis not present

## 2014-04-26 DIAGNOSIS — Z79899 Other long term (current) drug therapy: Secondary | ICD-10-CM | POA: Diagnosis not present

## 2014-04-26 DIAGNOSIS — E039 Hypothyroidism, unspecified: Secondary | ICD-10-CM | POA: Diagnosis not present

## 2014-04-26 DIAGNOSIS — E119 Type 2 diabetes mellitus without complications: Secondary | ICD-10-CM | POA: Diagnosis not present

## 2014-04-26 DIAGNOSIS — G459 Transient cerebral ischemic attack, unspecified: Secondary | ICD-10-CM

## 2014-04-26 DIAGNOSIS — I10 Essential (primary) hypertension: Secondary | ICD-10-CM | POA: Diagnosis not present

## 2014-04-26 DIAGNOSIS — G319 Degenerative disease of nervous system, unspecified: Secondary | ICD-10-CM | POA: Diagnosis not present

## 2014-04-26 DIAGNOSIS — K219 Gastro-esophageal reflux disease without esophagitis: Secondary | ICD-10-CM | POA: Diagnosis not present

## 2014-04-26 DIAGNOSIS — R079 Chest pain, unspecified: Secondary | ICD-10-CM | POA: Diagnosis not present

## 2014-04-26 DIAGNOSIS — M1990 Unspecified osteoarthritis, unspecified site: Secondary | ICD-10-CM | POA: Diagnosis not present

## 2014-04-26 DIAGNOSIS — Z880 Allergy status to penicillin: Secondary | ICD-10-CM | POA: Diagnosis not present

## 2014-04-26 DIAGNOSIS — I4891 Unspecified atrial fibrillation: Secondary | ICD-10-CM | POA: Diagnosis not present

## 2014-04-26 DIAGNOSIS — Z7983 Long term (current) use of bisphosphonates: Secondary | ICD-10-CM | POA: Diagnosis not present

## 2014-04-26 HISTORY — DX: Disorder of thyroid, unspecified: E07.9

## 2014-04-26 HISTORY — DX: Heart failure, unspecified: I50.9

## 2014-04-26 HISTORY — DX: Unspecified atrial fibrillation: I48.91

## 2014-04-26 LAB — POCT FASTING CBG KUC MANUAL ENTRY: POCT Glucose (KUC): 139 mg/dL — AB (ref 70–99)

## 2014-04-26 NOTE — ED Notes (Signed)
Pt reports waking up this AM feeling "drunk" and told a family member "I feel like I had a stroke". She has been off balance all day and c/o left lower ext heaviness and weakness. A and O x 4, pupils E&R. Pt taking Eliquis. Notified Dr. Cathren HarshBeese, applied 02.

## 2014-04-28 NOTE — ED Provider Notes (Signed)
CSN: [id number]     Arrival date & time 04/26/14  1457 History   First MD Initiated Contact with Patient 04/26/14 1517     Chief Complaint  Patient presents with  . Hypertension  . Weakness      HPI Comments: Patient reports that she awoke this morning about 7 hours ago with a left sided headache, now improved.  She noted that her right eyelid was "drooping" for a while, and then returned to normal.  She also noted that her right leg felt "clumsy," now improved.  She denies chest pain or shortness of breath.  No light-headedness or dizziness. She has a history of hypertension and atrial fib, and was recently switched from Coumadin to Eliquis.  The history is provided by the patient and a relative.    Past Medical History  Diagnosis Date  . Hypertension   . Seasonal allergies   . Depression   . Hyperlipidemia   . GERD (gastroesophageal reflux disease)   . Diabetes 1.5, managed as type 2   . CHF (congestive heart failure)   . Thyroid disease   . Atrial fibrillation    Past Surgical History  Procedure Laterality Date  . Back surgery    . Knee arthrocentesis     History reviewed. No pertinent family history. History  Substance Use Topics  . Smoking status: Never Smoker   . Smokeless tobacco: Not on file  . Alcohol Use: No   OB History    No data available     Review of Systems  Constitutional: Positive for fatigue. Negative for fever, chills and diaphoresis.  HENT: Negative for facial swelling and trouble swallowing.   Eyes: Negative for photophobia and visual disturbance.       Ptosis of right eyelid  Respiratory: Negative for chest tightness and shortness of breath.   Cardiovascular: Negative for chest pain and palpitations.  Gastrointestinal: Negative.   Genitourinary: Negative.   Musculoskeletal: Negative.   Skin: Negative.   Neurological: Positive for weakness and headaches. Negative for dizziness, seizures, syncope, facial asymmetry, speech difficulty,  light-headedness and numbness.    Allergies  Codeine and Penicillins  Home Medications   Prior to Admission medications   Medication Sig Start Date End Date Taking? Authorizing Provider  Apixaban (ELIQUIS PO) Take by mouth.   Yes Historical Provider, MD  carvedilol (COREG) 12.5 MG tablet Take 12.5 mg by mouth 2 (two) times daily with a meal.   Yes Historical Provider, MD  AMBULATORY NON FORMULARY MEDICATION Knee-high, medium compression, graduated compression stockings. Apply to lower extremities. 01/12/14   Monica Becton, MD  AMBULATORY NON FORMULARY MEDICATION Motorized scooter 04/10/14   Monica Becton, MD  AMBULATORY NON FORMULARY MEDICATION Light Weight Wheel Chair (size to be determined by weight and height) HT. 61 inches WT. 159 lbs Lumbar degenerative disc disease ICD - 10 M51.36 04/13/14   Monica Becton, MD  aspirin EC 81 MG tablet Take 81 mg by mouth.    Historical Provider, MD  atenolol (TENORMIN) 25 MG tablet Take 25 mg by mouth. 03/09/14 03/09/15  Historical Provider, MD  calcium carbonate (CALCIUM 600) 600 MG TABS tablet Take by mouth.    Historical Provider, MD  calcium carbonate (OS-CAL) 600 MG TABS tablet Take 600 mg by mouth.    Historical Provider, MD  furosemide (LASIX) 40 MG tablet Take 40 mg by mouth. 03/09/14 03/09/15  Historical Provider, MD  gabapentin (NEURONTIN) 300 MG capsule One tab PO qHS for a week, then  BID for a week, then TID. May double weekly to a max of 3,600mg /day 02/20/14   Monica Bectonhomas J Thekkekandam, MD  glipiZIDE (GLUCOTROL XL) 10 MG 24 hr tablet Take 20 mg by mouth daily.    Historical Provider, MD  HYDROcodone-acetaminophen (NORCO) 7.5-325 MG per tablet Take 1 tablet by mouth every 8 (eight) hours as needed. 03/01/14   Monica Bectonhomas J Thekkekandam, MD  ibandronate (BONIVA) 150 MG tablet Take 150 mg by mouth. 07/27/13   Historical Provider, MD  levothyroxine (SYNTHROID, LEVOTHROID) 75 MCG tablet Take 75 mcg by mouth daily.    Historical  Provider, MD  lidocaine (XYLOCAINE) 5 % ointment Apply 1 application topically 3 (three) times daily as needed. Affected area on right thigh 02/20/14   Monica Bectonhomas J Thekkekandam, MD  lisinopril (PRINIVIL,ZESTRIL) 40 MG tablet Take 40 mg by mouth daily.    Historical Provider, MD  meloxicam (MOBIC) 15 MG tablet One tab PO qAM with breakfast for 2 weeks, then daily prn pain. 01/12/14   Monica Bectonhomas J Thekkekandam, MD  Multiple Vitamins-Minerals (CENTRUM SILVER PO) Take by mouth.    Historical Provider, MD  nabumetone (RELAFEN) 500 MG tablet Take 500 mg by mouth 2 (two) times daily.    Historical Provider, MD  nortriptyline (PAMELOR) 25 MG capsule Take 25 mg by mouth 2 (two) times daily.    Historical Provider, MD  omeprazole (PRILOSEC) 40 MG capsule Take 40 mg by mouth daily.    Historical Provider, MD  Potassium 99 MG TABS Take by mouth.    Historical Provider, MD  simvastatin (ZOCOR) 20 MG tablet Take 20 mg by mouth every evening.    Historical Provider, MD   BP 179/118 mmHg  Pulse 93  Resp 18  SpO2 95% Physical Exam Nursing notes and Vital Signs reviewed. Appearance:  Patient appears stated age, and in no acute distress.  She is alert and oriented.  Eyes:  Pupils are equal, round, and reactive to light and accomodation.  Extraocular movement is intact.  Conjunctivae are not inflamed.  No ptosis present   Nose:  Normal  Mouth/Pharynx:  Tongue midline; moist mucous membranes  Neck:  Supple.  No adenopathy.  Carotids:  No bruits heard Lungs:  Clear to auscultation.  Breath sounds are equal.  Heart:  Irregular rate and rhythm Abdomen:  Nontender. Extremities:  No edema.  No calf tenderness Skin:  No rash present.   Neurologic:  Cranial nerves 2 through 12 appear to be normal. Cerebellar function not tested.  Grip strength symmetric bilaterally.  Mild weakness right lower extremity compared to left.  ED Course  Procedures  None    Labs Reviewed  POCT FASTING CBG [facility] MANUAL ENTRY - Abnormal; Notable  for the following:    POCT Glucose ([facility]) 139 (*)           MDM   1. Transient cerebral ischemia, unspecified transient cerebral ischemia type, vs early stroke   With a history of atrial fib, suspect thrombotic 2.      Hypertension uncontrolled 3.      Chronic atrial fibrillation  O2 by nasal cannula administered.  Because of her current treatment with Eliquis, will defer administering aspirin. [facility].  Vital signs and neurologic status stable at time of discharge.    Lattie HawStephen A Beese, MD 04/29/14 1024

## 2014-04-29 NOTE — Discharge Instructions (Signed)
Transient Ischemic Attack  A transient ischemic attack (TIA) is a "warning stroke" that causes stroke-like symptoms. Unlike a stroke, a TIA does not cause permanent damage to the brain. The symptoms of a TIA can happen very fast and do not last long. It is important to know the symptoms of a TIA and what to do. This can help prevent a major stroke or death.  CAUSES   · A TIA is caused by a temporary blockage in an artery in the brain or neck (carotid artery). The blockage does not allow the brain to get the blood supply it needs and can cause different symptoms. The blockage can be caused by either:  ¨ A blood clot.  ¨ Fatty buildup (plaque) in a neck or brain artery.  RISK FACTORS  · High blood pressure (hypertension).  · High cholesterol.  · Diabetes mellitus.  · Heart disease.  · The build up of plaque in the blood vessels (peripheral artery disease or atherosclerosis).  · The build up of plaque in the blood vessels providing blood and oxygen to the brain (carotid artery stenosis).  · An abnormal heart rhythm (atrial fibrillation).  · Obesity.  · Smoking.  · Taking oral contraceptives (especially in combination with smoking).  · Physical inactivity.  · A diet high in fats, salt (sodium), and calories.  · Alcohol use.  · Use of illegal drugs (especially cocaine and methamphetamine).  · Being female.  · Being African American.  · Being over the age of 55.  · Family history of stroke.  · Previous history of blood clots, stroke, TIA, or heart attack.  · Sickle cell disease.  SYMPTOMS   TIA symptoms are the same as a stroke but are temporary. These symptoms usually develop suddenly, or may be newly present upon awakening from sleep:  · Sudden weakness or numbness of the face, arm, or leg, especially on one side of the body.  · Sudden trouble walking or difficulty moving arms or legs.  · Sudden confusion.  · Sudden personality changes.  · Trouble speaking (aphasia) or understanding.  · Difficulty swallowing.  · Sudden  trouble seeing in one or both eyes.  · Double vision.  · Dizziness.  · Loss of balance or coordination.  · Sudden severe headache with no known cause.  · Trouble reading or writing.  · Loss of bowel or bladder control.  · Loss of consciousness.  DIAGNOSIS   Your caregiver may be able to determine the presence or absence of a TIA based on your symptoms, history, and physical exam. Computed tomography (CT scan) of the brain is usually performed to help identify a TIA. Other tests may be done to diagnose a TIA. These tests may include:  · Electrocardiography.  · Continuous heart monitoring.  · Echocardiography.  · Carotid ultrasonography.  · Magnetic resonance imaging (MRI).  · A scan of the brain circulation.  · Blood tests.  PREVENTION   The risk of a TIA can be decreased by appropriately treating high blood pressure, high cholesterol, diabetes, heart disease, and obesity and by quitting smoking, limiting alcohol, and staying physically active.  TREATMENT   Time is of the essence. Since the symptoms of TIA are the same as a stroke, it is important to seek treatment as soon as possible because you may need a medicine to dissolve the clot (thrombolytic) that cannot be given if too much time has passed. Treatment options vary. Treatment options may include rest, oxygen, intravenous (IV) fluids,   and medicines to thin the blood (anticoagulants). Medicines and diet may be used to address diabetes, high blood pressure, and other risk factors. Measures will be taken to prevent short-term and long-term complications, including infection from breathing foreign material into the lungs (aspiration pneumonia), blood clots in the legs, and falls. Treatment options include procedures to either remove plaque in the carotid arteries or dilate carotid arteries that have narrowed due to plaque. Those procedures are:  · Carotid endarterectomy.  · Carotid angioplasty and stenting.  HOME CARE INSTRUCTIONS   · Take all medicines prescribed  by your caregiver. Follow the directions carefully. Medicines may be used to control risk factors for a stroke. Be sure you understand all your medicine instructions.  · You may be told to take aspirin or the anticoagulant warfarin. Warfarin needs to be taken exactly as instructed.  ¨ Taking too much or too little warfarin is dangerous. Too much warfarin increases the risk of bleeding. Too little warfarin continues to allow the risk for blood clots. While taking warfarin, you will need to have regular blood tests to measure your blood clotting time. A PT blood test measures how long it takes for blood to clot. Your PT is used to calculate another value called an INR. Your PT and INR help your caregiver to adjust your dose of warfarin. The dose can change for many reasons. It is critically important that you take warfarin exactly as prescribed.  ¨ Many foods, especially foods high in vitamin K can interfere with warfarin and affect the PT and INR. Foods high in vitamin K include spinach, kale, broccoli, cabbage, collard and turnip greens, brussels sprouts, peas, cauliflower, seaweed, and parsley as well as beef and pork liver, green tea, and soybean oil. You should eat a consistent amount of foods high in vitamin K. Avoid major changes in your diet, or notify your caregiver before changing your diet. Arrange a visit with a dietitian to answer your questions.  ¨ Many medicines can interfere with warfarin and affect the PT and INR. You must tell your caregiver about any and all medicines you take, this includes all vitamins and supplements. Be especially cautious with aspirin and anti-inflammatory medicines. Do not take or discontinue any prescribed or over-the-counter medicine except on the advice of your caregiver or pharmacist.  ¨ Warfarin can have side effects, such as excessive bruising or bleeding. You will need to hold pressure over cuts for longer than usual. Your caregiver or pharmacist will discuss other  potential side effects.  ¨ Avoid sports or activities that may cause injury or bleeding.  ¨ Be mindful when shaving, flossing your teeth, or handling sharp objects.  ¨ Alcohol can change the body's ability to handle warfarin. It is best to avoid alcoholic drinks or consume only very small amounts while taking warfarin. Notify your caregiver if you change your alcohol intake.  ¨ Notify your dentist or other caregivers before procedures.  · Eat a diet that includes 5 or more servings of fruits and vegetables each day. This may reduce the risk of stroke. Certain diets may be prescribed to address high blood pressure, high cholesterol, diabetes, or obesity.  ¨ A low-sodium, low-saturated fat, low-trans fat, low-cholesterol diet is recommended to manage high blood pressure.  ¨ A low-saturated fat, low-trans fat, low-cholesterol, and high-fiber diet may control cholesterol levels.  ¨ A controlled-carbohydrate, controlled-sugar diet is recommended to manage diabetes.  ¨ A reduced-calorie, low-sodium, low-saturated fat, low-trans fat, low-cholesterol diet is recommended to manage obesity.  ·   Maintain a healthy weight.  · Stay physically active. It is recommended that you get at least 30 minutes of activity on most or all days.  · Do not smoke.  · Limit alcohol use even if you are not taking warfarin. Moderate alcohol use is considered to be:  ¨ No more than 2 drinks each day for men.  ¨ No more than 1 drink each day for nonpregnant women.  · Stop drug abuse.  · Home safety. A safe home environment is important to reduce the risk of falls. Your caregiver may arrange for specialists to evaluate your home. Having grab bars in the bedroom and bathroom is often important. Your caregiver may arrange for equipment to be used at home, such as raised toilets and a seat for the shower.  · Follow all instructions for follow-up with your caregiver. This is very important. This includes any referrals and lab tests. Proper follow up can  prevent a stroke or another TIA from occurring.  SEEK MEDICAL CARE IF:  · You have personality changes.  · You have difficulty swallowing.  · You are seeing double.  · You have dizziness.  · You have a fever.  · You have skin breakdown.  SEEK IMMEDIATE MEDICAL CARE IF:   Any of these symptoms may represent a serious problem that is an emergency. Do not wait to see if the symptoms will go away. Get medical help right away. Call your local emergency services (911 in [place] Do not drive yourself to the hospital.  · You have sudden weakness or numbness of the face, arm, or leg, especially on one side of the body.  · You have sudden trouble walking or difficulty moving arms or legs.  · You have sudden confusion.  · You have trouble speaking (aphasia) or understanding.  · You have sudden trouble seeing in one or both eyes.  · You have a loss of balance or coordination.  · You have a sudden, severe headache with no known cause.  · You have new chest pain or an irregular heartbeat.  · You have a partial or total loss of consciousness.  MAKE SURE YOU:   · Understand these instructions.  · Will watch your condition.  · Will get help right away if you are not doing well or get worse.  Document Released: 12/25/2004 Document Revised: 03/22/2013 Document Reviewed: 06/22/2013  Exit[employer]® Patient Information ©2015 [employer]. This information is not intended to replace advice given to you by your health care provider. Make sure you discuss any questions you have with your health care provider.

## 2014-05-02 MED ORDER — AMBULATORY NON FORMULARY MEDICATION: Status: DC

## 2014-05-02 NOTE — Telephone Encounter (Signed)
Order/Rx in my box.

## 2014-05-02 NOTE — Telephone Encounter (Signed)
Patient's insurance cannot authorize your order for a "light weight w/c". The medical supply company asks that you fax a new order for a "transport w/c", and this will be covered. Their fax #[phone].

## 2014-05-04 DIAGNOSIS — M5136 Other intervertebral disc degeneration, lumbar region: Secondary | ICD-10-CM | POA: Diagnosis not present

## 2014-05-09 DIAGNOSIS — I272 Pulmonary hypertension, unspecified: Secondary | ICD-10-CM | POA: Insufficient documentation

## 2014-05-09 DIAGNOSIS — I480 Paroxysmal atrial fibrillation: Secondary | ICD-10-CM | POA: Diagnosis not present

## 2014-05-09 DIAGNOSIS — I071 Rheumatic tricuspid insufficiency: Secondary | ICD-10-CM | POA: Insufficient documentation

## 2014-05-09 DIAGNOSIS — I429 Cardiomyopathy, unspecified: Secondary | ICD-10-CM | POA: Diagnosis not present

## 2014-05-09 DIAGNOSIS — I10 Essential (primary) hypertension: Secondary | ICD-10-CM | POA: Diagnosis not present

## 2014-05-09 DIAGNOSIS — I340 Nonrheumatic mitral (valve) insufficiency: Secondary | ICD-10-CM | POA: Insufficient documentation

## 2014-05-24 ENCOUNTER — Other Ambulatory Visit: Payer: Self-pay

## 2014-05-24 DIAGNOSIS — M5136 Other intervertebral disc degeneration, lumbar region: Secondary | ICD-10-CM

## 2014-05-24 MED ORDER — LIDOCAINE 5 % EX OINT: 1.0000 "application " | TOPICAL_OINTMENT | Freq: Three times a day (TID) | CUTANEOUS | Status: AC | PRN

## 2014-06-02 DIAGNOSIS — M5136 Other intervertebral disc degeneration, lumbar region: Secondary | ICD-10-CM | POA: Diagnosis not present

## 2014-06-15 DIAGNOSIS — I071 Rheumatic tricuspid insufficiency: Secondary | ICD-10-CM | POA: Diagnosis not present

## 2014-06-15 DIAGNOSIS — I340 Nonrheumatic mitral (valve) insufficiency: Secondary | ICD-10-CM | POA: Diagnosis not present

## 2014-06-15 DIAGNOSIS — I429 Cardiomyopathy, unspecified: Secondary | ICD-10-CM | POA: Diagnosis not present

## 2014-06-15 DIAGNOSIS — I10 Essential (primary) hypertension: Secondary | ICD-10-CM | POA: Diagnosis not present

## 2014-06-15 DIAGNOSIS — I480 Paroxysmal atrial fibrillation: Secondary | ICD-10-CM | POA: Diagnosis not present

## 2014-06-22 DIAGNOSIS — E039 Hypothyroidism, unspecified: Secondary | ICD-10-CM | POA: Diagnosis not present

## 2014-06-22 DIAGNOSIS — Z885 Allergy status to narcotic agent status: Secondary | ICD-10-CM | POA: Diagnosis not present

## 2014-06-22 DIAGNOSIS — R296 Repeated falls: Secondary | ICD-10-CM | POA: Diagnosis not present

## 2014-06-22 DIAGNOSIS — I517 Cardiomegaly: Secondary | ICD-10-CM | POA: Diagnosis not present

## 2014-06-22 DIAGNOSIS — K219 Gastro-esophageal reflux disease without esophagitis: Secondary | ICD-10-CM | POA: Diagnosis not present

## 2014-06-22 DIAGNOSIS — I4891 Unspecified atrial fibrillation: Secondary | ICD-10-CM | POA: Diagnosis not present

## 2014-06-22 DIAGNOSIS — Z87440 Personal history of urinary (tract) infections: Secondary | ICD-10-CM | POA: Diagnosis not present

## 2014-06-22 DIAGNOSIS — I10 Essential (primary) hypertension: Secondary | ICD-10-CM | POA: Diagnosis not present

## 2014-06-22 DIAGNOSIS — S299XXA Unspecified injury of thorax, initial encounter: Secondary | ICD-10-CM | POA: Diagnosis not present

## 2014-06-22 DIAGNOSIS — M79651 Pain in right thigh: Secondary | ICD-10-CM | POA: Diagnosis not present

## 2014-06-22 DIAGNOSIS — S0990XA Unspecified injury of head, initial encounter: Secondary | ICD-10-CM | POA: Diagnosis not present

## 2014-06-22 DIAGNOSIS — M1990 Unspecified osteoarthritis, unspecified site: Secondary | ICD-10-CM | POA: Diagnosis not present

## 2014-06-22 DIAGNOSIS — Z880 Allergy status to penicillin: Secondary | ICD-10-CM | POA: Diagnosis not present

## 2014-06-22 DIAGNOSIS — E119 Type 2 diabetes mellitus without complications: Secondary | ICD-10-CM | POA: Diagnosis not present

## 2014-06-22 DIAGNOSIS — S199XXA Unspecified injury of neck, initial encounter: Secondary | ICD-10-CM | POA: Diagnosis not present

## 2014-06-22 DIAGNOSIS — S7012XA Contusion of left thigh, initial encounter: Secondary | ICD-10-CM | POA: Diagnosis not present

## 2014-06-22 DIAGNOSIS — S0181XA Laceration without foreign body of other part of head, initial encounter: Secondary | ICD-10-CM | POA: Diagnosis not present

## 2014-06-22 DIAGNOSIS — Z791 Long term (current) use of non-steroidal anti-inflammatories (NSAID): Secondary | ICD-10-CM | POA: Diagnosis not present

## 2014-06-22 DIAGNOSIS — Z79899 Other long term (current) drug therapy: Secondary | ICD-10-CM | POA: Diagnosis not present

## 2014-06-22 DIAGNOSIS — R2689 Other abnormalities of gait and mobility: Secondary | ICD-10-CM | POA: Diagnosis not present

## 2014-06-22 DIAGNOSIS — Z7901 Long term (current) use of anticoagulants: Secondary | ICD-10-CM | POA: Diagnosis not present

## 2014-06-22 DIAGNOSIS — S0083XA Contusion of other part of head, initial encounter: Secondary | ICD-10-CM | POA: Diagnosis not present

## 2014-06-22 DIAGNOSIS — W01198A Fall on same level from slipping, tripping and stumbling with subsequent striking against other object, initial encounter: Secondary | ICD-10-CM | POA: Diagnosis not present

## 2014-06-29 DIAGNOSIS — Z4802 Encounter for removal of sutures: Secondary | ICD-10-CM | POA: Diagnosis not present

## 2014-07-03 DIAGNOSIS — M5136 Other intervertebral disc degeneration, lumbar region: Secondary | ICD-10-CM | POA: Diagnosis not present

## 2014-07-04 DIAGNOSIS — E039 Hypothyroidism, unspecified: Secondary | ICD-10-CM | POA: Diagnosis not present

## 2014-07-04 DIAGNOSIS — E780 Pure hypercholesterolemia: Secondary | ICD-10-CM | POA: Diagnosis not present

## 2014-07-04 DIAGNOSIS — E119 Type 2 diabetes mellitus without complications: Secondary | ICD-10-CM | POA: Diagnosis not present

## 2014-07-04 DIAGNOSIS — I10 Essential (primary) hypertension: Secondary | ICD-10-CM | POA: Diagnosis not present

## 2014-07-04 DIAGNOSIS — I4891 Unspecified atrial fibrillation: Secondary | ICD-10-CM | POA: Diagnosis not present

## 2014-07-06 DIAGNOSIS — I480 Paroxysmal atrial fibrillation: Secondary | ICD-10-CM | POA: Diagnosis not present

## 2014-07-06 DIAGNOSIS — I429 Cardiomyopathy, unspecified: Secondary | ICD-10-CM | POA: Diagnosis not present

## 2014-07-06 DIAGNOSIS — I340 Nonrheumatic mitral (valve) insufficiency: Secondary | ICD-10-CM | POA: Diagnosis not present

## 2014-07-06 DIAGNOSIS — I5022 Chronic systolic (congestive) heart failure: Secondary | ICD-10-CM | POA: Diagnosis not present

## 2014-07-06 DIAGNOSIS — I10 Essential (primary) hypertension: Secondary | ICD-10-CM | POA: Diagnosis not present

## 2014-08-02 DIAGNOSIS — M5136 Other intervertebral disc degeneration, lumbar region: Secondary | ICD-10-CM | POA: Diagnosis not present

## 2014-08-17 DIAGNOSIS — I482 Chronic atrial fibrillation: Secondary | ICD-10-CM | POA: Diagnosis not present

## 2014-08-17 DIAGNOSIS — I429 Cardiomyopathy, unspecified: Secondary | ICD-10-CM | POA: Diagnosis not present

## 2014-08-17 DIAGNOSIS — I10 Essential (primary) hypertension: Secondary | ICD-10-CM | POA: Diagnosis not present

## 2014-08-17 DIAGNOSIS — I5022 Chronic systolic (congestive) heart failure: Secondary | ICD-10-CM | POA: Diagnosis not present

## 2014-08-17 DIAGNOSIS — E119 Type 2 diabetes mellitus without complications: Secondary | ICD-10-CM | POA: Diagnosis not present

## 2014-09-02 DIAGNOSIS — M5136 Other intervertebral disc degeneration, lumbar region: Secondary | ICD-10-CM | POA: Diagnosis not present

## 2014-09-19 DIAGNOSIS — I5022 Chronic systolic (congestive) heart failure: Secondary | ICD-10-CM | POA: Diagnosis not present

## 2014-09-19 DIAGNOSIS — I517 Cardiomegaly: Secondary | ICD-10-CM | POA: Diagnosis not present

## 2014-09-19 DIAGNOSIS — I482 Chronic atrial fibrillation: Secondary | ICD-10-CM | POA: Diagnosis not present

## 2014-09-19 DIAGNOSIS — I10 Essential (primary) hypertension: Secondary | ICD-10-CM | POA: Diagnosis not present

## 2014-09-19 DIAGNOSIS — I429 Cardiomyopathy, unspecified: Secondary | ICD-10-CM | POA: Diagnosis not present

## 2014-09-19 DIAGNOSIS — I081 Rheumatic disorders of both mitral and tricuspid valves: Secondary | ICD-10-CM | POA: Diagnosis not present

## 2014-09-27 DIAGNOSIS — I482 Chronic atrial fibrillation: Secondary | ICD-10-CM | POA: Diagnosis not present

## 2014-09-27 DIAGNOSIS — I10 Essential (primary) hypertension: Secondary | ICD-10-CM | POA: Diagnosis not present

## 2014-09-27 DIAGNOSIS — I5022 Chronic systolic (congestive) heart failure: Secondary | ICD-10-CM | POA: Diagnosis not present

## 2014-09-27 DIAGNOSIS — I429 Cardiomyopathy, unspecified: Secondary | ICD-10-CM | POA: Diagnosis not present

## 2014-10-02 DIAGNOSIS — M5136 Other intervertebral disc degeneration, lumbar region: Secondary | ICD-10-CM | POA: Diagnosis not present

## 2014-10-16 ENCOUNTER — Emergency Department
Admission: EM | Admit: 2014-10-16 | Discharge: 2014-10-16 | Disposition: A | Discharge status: Home / Self Care | Payer: Medicare Other | Source: Home / Self Care | Attending: Family Medicine | Admitting: Family Medicine

## 2014-10-16 ENCOUNTER — Encounter: Payer: Self-pay | Admitting: Emergency Medicine

## 2014-10-16 DIAGNOSIS — R300 Dysuria: Secondary | ICD-10-CM | POA: Diagnosis not present

## 2014-10-16 LAB — POCT URINALYSIS DIP (MANUAL ENTRY)
BILIRUBIN UA: NEGATIVE
Glucose, UA: NEGATIVE
Ketones, POC UA: NEGATIVE
Nitrite, UA: POSITIVE — AB
PH UA: 7.5 (ref 5–8)
SPEC GRAV UA: 1.015 (ref 1.005–1.03)
Urobilinogen, UA: 4 (ref 0–1)

## 2014-10-16 MED ORDER — SULFAMETHOXAZOLE-TRIMETHOPRIM 800-160 MG PO TABS: 1.0000 | ORAL_TABLET | Freq: Two times a day (BID) | ORAL | Status: DC

## 2014-10-16 NOTE — Discharge Instructions (Signed)
Increase fluid intake.  May use non-prescription AZO for about two days, if desired, to decrease urinary discomfort. If symptoms become significantly worse during the night or over the weekend, proceed to the local emergency room.  ° ° °Dysuria °Dysuria is the medical term for pain with urination. There are many causes for dysuria, but urinary tract infection is the most common. If a urinalysis was performed it can show that there is a urinary tract infection. A urine culture confirms that you or your child is sick. You will need to follow up with a healthcare provider because: °· If a urine culture was done you will need to know the culture results and treatment recommendations. °· If the urine culture was positive, you or your child will need to be put on antibiotics or know if the antibiotics prescribed are the right antibiotics for your urinary tract infection. °· If the urine culture is negative (no urinary tract infection), then other causes may need to be explored or antibiotics need to be stopped. °Today laboratory work may have been done and there does not seem to be an infection. If cultures were done they will take at least 24 to 48 hours to be completed. °Today x-rays may have been taken and they read as normal. No cause can be found for the problems. The x-rays may be re-read by a radiologist and you will be contacted if additional findings are made. °You or your child may have been put on medications to help with this problem until you can see your primary caregiver. If the problems get better, see your primary caregiver if the problems return. If you were given antibiotics (medications which kill germs), take all of the mediations as directed for the full course of treatment.  °If laboratory work was done, you need to find the results. Leave a telephone number where you can be reached. If this is not possible, make sure you find out how you are to get test results. °HOME CARE INSTRUCTIONS  °· Drink  lots of fluids. For adults, drink eight, 8 ounce glasses of clear juice or water a day. For children, replace fluids as suggested by your caregiver. °· Empty the bladder often. Avoid holding urine for long periods of time. °· After a bowel movement, women should cleanse front to back, using each tissue only once. °· Empty your bladder before and after sexual intercourse. °· Take all the medicine given to you until it is gone. You may feel better in a few days, but TAKE ALL MEDICINE. °· Avoid caffeine, tea, alcohol and carbonated beverages, because they tend to irritate the bladder. °· In men, alcohol may irritate the prostate. °· Only take over-the-counter or prescription medicines for pain, discomfort, or fever as directed by your caregiver. °· If your caregiver has given you a follow-up appointment, it is very important to keep that appointment. Not keeping the appointment could result in a chronic or permanent injury, pain, and disability. If there is any problem keeping the appointment, you must call back to this facility for assistance. °SEEK IMMEDIATE MEDICAL CARE IF:  °· Back pain develops. °· A fever develops. °· There is nausea (feeling sick to your stomach) or vomiting (throwing up). °· Problems are no better with medications or are getting worse. °MAKE SURE YOU:  °· Understand these instructions. °· Will watch your condition. °· Will get help right away if you are not doing well or get worse. °Document Released: 12/14/2003 Document Revised: 06/09/2011 Document Reviewed: 10/21/2007 °  Exit[employer]® Patient Information ©2015 [employer]. This information is not intended to replace advice given to you by your health care provider. Make sure you discuss any questions you have with your health care provider. ° °

## 2014-10-16 NOTE — ED Provider Notes (Signed)
CSN: [id number]     Arrival date & time 10/16/14  1151 History   First MD Initiated Contact with Patient 10/16/14 1222     Chief Complaint  Patient presents with  . Dysuria     HPI Comments: Patient complains of three day history of dysuria, malodorous urine, and incontinence.  No abdominal pain.  No fevers, chills, and sweats.  Patient is a 81 y.o. female presenting with dysuria. The history is provided by the patient and a relative.  Dysuria Pain quality:  Burning Pain severity:  Moderate Onset quality:  Sudden Duration:  3 days Timing:  Constant Progression:  Worsening Chronicity:  Recurrent Recent urinary tract infections: no   Relieved by:  Nothing Worsened by:  Nothing tried Ineffective treatments:  None tried Urinary symptoms: foul-smelling urine, frequent urination, hesitancy and incontinence   Urinary symptoms: no discolored urine and no hematuria   Associated symptoms: no abdominal pain, no fever, no flank pain, no genital lesions, no nausea, no vaginal discharge and no vomiting     Past Medical History  Diagnosis Date  . Hypertension   . Seasonal allergies   . Depression   . Hyperlipidemia   . GERD (gastroesophageal reflux disease)   . Diabetes 1.5, managed as type 2   . CHF (congestive heart failure)   . Thyroid disease   . Atrial fibrillation    Past Surgical History  Procedure Laterality Date  . Back surgery    . Knee arthrocentesis     No family history on file. History  Substance Use Topics  . Smoking status: Never Smoker   . Smokeless tobacco: Not on file  . Alcohol Use: No   OB History    No data available     Review of Systems  Constitutional: Negative for fever.  Gastrointestinal: Negative for nausea, vomiting and abdominal pain.  Genitourinary: Positive for dysuria. Negative for flank pain and vaginal discharge.  All other systems reviewed and are negative.   Allergies  Codeine and Penicillins  Home Medications   Prior to  Admission medications   Medication Sig Start Date End Date Taking? Authorizing Provider  AMBULATORY NON FORMULARY MEDICATION Knee-high, medium compression, graduated compression stockings. Apply to lower extremities. 01/12/14   Monica Becton, MD  AMBULATORY NON FORMULARY MEDICATION Motorized scooter 04/10/14   Monica Becton, MD  AMBULATORY NON FORMULARY MEDICATION Transport Wheel Chair (size to be determined by weight and height) HT. 61 inches WT. 159 lbs Lumbar degenerative disc disease ICD - 10 M51.36  All other parts of the order unchanged. 05/02/14   Monica Becton, MD  Apixaban (ELIQUIS PO) Take by mouth.    Historical Provider, MD  aspirin EC 81 MG tablet Take 81 mg by mouth.    Historical Provider, MD  atenolol (TENORMIN) 25 MG tablet Take 25 mg by mouth. 03/09/14 03/09/15  Historical Provider, MD  calcium carbonate (CALCIUM 600) 600 MG TABS tablet Take by mouth.    Historical Provider, MD  calcium carbonate (OS-CAL) 600 MG TABS tablet Take 600 mg by mouth.    Historical Provider, MD  carvedilol (COREG) 12.5 MG tablet Take 12.5 mg by mouth 2 (two) times daily with a meal.    Historical Provider, MD  furosemide (LASIX) 40 MG tablet Take 40 mg by mouth. 03/09/14 03/09/15  Historical Provider, MD  gabapentin (NEURONTIN) 300 MG capsule One tab PO qHS for a week, then BID for a week, then TID. May double weekly to a max of 3,600mg /day  02/20/14   Monica Bectonhomas J Thekkekandam, MD  glipiZIDE (GLUCOTROL XL) 10 MG 24 hr tablet Take 20 mg by mouth daily.    Historical Provider, MD  HYDROcodone-acetaminophen (NORCO) 7.5-325 MG per tablet Take 1 tablet by mouth every 8 (eight) hours as needed. 03/01/14   Monica Bectonhomas J Thekkekandam, MD  ibandronate (BONIVA) 150 MG tablet Take 150 mg by mouth. 07/27/13   Historical Provider, MD  levothyroxine (SYNTHROID, LEVOTHROID) 75 MCG tablet Take 75 mcg by mouth daily.    Historical Provider, MD  lidocaine (XYLOCAINE) 5 % ointment Apply 1 application topically 3  (three) times daily as needed. Affected area on right thigh 05/24/14   Monica Bectonhomas J Thekkekandam, MD  lisinopril (PRINIVIL,ZESTRIL) 40 MG tablet Take 40 mg by mouth daily.    Historical Provider, MD  meloxicam (MOBIC) 15 MG tablet One tab PO qAM with breakfast for 2 weeks, then daily prn pain. 01/12/14   Monica Bectonhomas J Thekkekandam, MD  Multiple Vitamins-Minerals (CENTRUM SILVER PO) Take by mouth.    Historical Provider, MD  nabumetone (RELAFEN) 500 MG tablet Take 500 mg by mouth 2 (two) times daily.    Historical Provider, MD  nortriptyline (PAMELOR) 25 MG capsule Take 25 mg by mouth 2 (two) times daily.    Historical Provider, MD  omeprazole (PRILOSEC) 40 MG capsule Take 40 mg by mouth daily.    Historical Provider, MD  Potassium 99 MG TABS Take by mouth.    Historical Provider, MD  simvastatin (ZOCOR) 20 MG tablet Take 20 mg by mouth every evening.    Historical Provider, MD  sulfamethoxazole-trimethoprim (BACTRIM DS,SEPTRA DS) 800-160 MG per tablet Take 1 tablet by mouth 2 (two) times daily. 10/16/14   Lattie HawStephen A Beese, MD   BP 139/83 mmHg  Pulse 69  Temp(Src) 97.5 F (36.4 C) (Oral)  Ht 5\' 1"  (1.549 m)  Wt 160 lb (72.576 kg)  BMI 30.25 kg/m2  SpO2 95% Physical Exam Nursing notes and Vital Signs reviewed. Appearance:  Patient appears stated age, and in no acute distress Eyes:  Pupils are equal, round, and reactive to light and accomodation.  Extraocular movement is intact.  Conjunctivae are not inflamed  Nose:  Normal externally Mouth/Pharynx:  Normal; moist mucous membranes  Neck:  Supple.  No adenopathy Lungs:  Clear to auscultation.  Breath sounds are equal.  Moving air well. Heart:  Regular rate and rhythm without murmurs, rubs, or gallops.  Abdomen:  Nontender without masses.  Bowel sounds are present.  No CVA or flank tenderness.  Extremities:  No edema.  No calf tenderness Skin:  No rash present.   ED Course  Procedures  None    Labs Reviewed  URINE CULTURE  POCT URINALYSIS DIP  (MANUAL ENTRY):  BLO moderate; PRO 100mg /dL; URO 4.0 EU/dL; NIT positive; LEU large      MDM   1. Dysuria     Urine culture pending Begin Bactrim DS BID for 3 days. Increase fluid intake. May use non-prescription AZO for about two days, if desired, to decrease urinary discomfort.  If symptoms become significantly worse during the night or over the weekend, proceed to the local emergency room.  Followup with Family Doctor if not improved in one week    Lattie HawStephen A Beese, MD 10/22/14 Rickey [id number]

## 2014-10-16 NOTE — ED Notes (Signed)
Dysuria x 4 days 

## 2014-10-18 ENCOUNTER — Telehealth: Payer: Self-pay | Admitting: *Deleted

## 2014-10-18 ENCOUNTER — Telehealth: Payer: Self-pay | Admitting: Emergency Medicine

## 2014-10-19 LAB — URINE CULTURE: Colony Count: >=100000

## 2014-10-30 ENCOUNTER — Other Ambulatory Visit: Payer: Self-pay | Admitting: Sports Medicine

## 2014-10-30 DIAGNOSIS — M5136 Other intervertebral disc degeneration, lumbar region: Secondary | ICD-10-CM

## 2014-10-30 MED ORDER — GABAPENTIN 300 MG PO CAPS
ORAL_CAPSULE | ORAL | Status: DC
Start: 2014-10-30 — End: 2014-11-06

## 2014-10-30 NOTE — Telephone Encounter (Signed)
Requested refill on gabapentin, advised it was time for f/u appt. Will send 2 weeks supply of Rx but Pt must keep upcoming appt.

## 2014-11-02 DIAGNOSIS — M5136 Other intervertebral disc degeneration, lumbar region: Secondary | ICD-10-CM | POA: Diagnosis not present

## 2014-11-06 ENCOUNTER — Ambulatory Visit (INDEPENDENT_AMBULATORY_CARE_PROVIDER_SITE_OTHER): Payer: Medicare Other | Admitting: Sports Medicine

## 2014-11-06 ENCOUNTER — Encounter: Payer: Self-pay | Admitting: Sports Medicine

## 2014-11-06 DIAGNOSIS — M5136 Other intervertebral disc degeneration, lumbar region: Secondary | ICD-10-CM | POA: Diagnosis not present

## 2014-11-06 DIAGNOSIS — M51369 Other intervertebral disc degeneration, lumbar region without mention of lumbar back pain or lower extremity pain: Secondary | ICD-10-CM

## 2014-11-06 MED ORDER — GABAPENTIN 300 MG PO CAPS: 300.0000 mg | ORAL_CAPSULE | Freq: Every day | ORAL | Status: DC

## 2014-11-06 NOTE — Progress Notes (Signed)
  Subjective:    CC: Follow-up  HPI:  Lumbar degenerative disc disease: Good response to her previous epidural, also doing well with gabapentin at bedtime, she did have some falls at 900 mg of gabapentin at bedtime, and is amenable to decrease the dose.  Past medical history, Surgical history, Family history not pertinant except as noted below, Social history, Allergies, and medications have been entered into the medical record, reviewed, and no changes needed.   Review of Systems: No fevers, chills, night sweats, weight loss, chest pain, or shortness of breath.   Objective:    General: Well Developed, well nourished, and in no acute distress.  Neuro: Alert and oriented x3, extra-ocular muscles intact, sensation grossly intact.  HEENT: Normocephalic, atraumatic, pupils equal round reactive to light, neck supple, no masses, no lymphadenopathy, thyroid nonpalpable.  Skin: Warm and dry, no rashes. Cardiac: Regular rate and rhythm, no murmurs rubs or gallops, no lower extremity edema.  Respiratory: Clear to auscultation bilaterally. Not using accessory muscles, speaking in full sentences.  Impression and Recommendations:   I spent 25 minutes with this patient, greater than 50% was face-to-face time counseling regarding the above diagnoses

## 2014-11-06 NOTE — Assessment & Plan Note (Signed)
Doing extremely well with gabapentin at bedtime. Refilling this, she did have a few falls but it sounds as though these were in the setting of her having a concurrent acute illness. She also had a fantastic response to an L4-L5 interlaminar epidural 8 months ago, ordering a second injection.

## 2014-11-15 DIAGNOSIS — Z1289 Encounter for screening for malignant neoplasm of other sites: Secondary | ICD-10-CM | POA: Diagnosis not present

## 2014-11-15 DIAGNOSIS — Z0000 Encounter for general adult medical examination without abnormal findings: Secondary | ICD-10-CM | POA: Diagnosis not present

## 2014-11-15 DIAGNOSIS — I10 Essential (primary) hypertension: Secondary | ICD-10-CM | POA: Diagnosis not present

## 2014-11-15 DIAGNOSIS — M810 Age-related osteoporosis without current pathological fracture: Secondary | ICD-10-CM | POA: Diagnosis not present

## 2014-11-15 DIAGNOSIS — M899 Disorder of bone, unspecified: Secondary | ICD-10-CM | POA: Diagnosis not present

## 2014-11-15 DIAGNOSIS — Z23 Encounter for immunization: Secondary | ICD-10-CM | POA: Diagnosis not present

## 2014-11-15 DIAGNOSIS — E780 Pure hypercholesterolemia: Secondary | ICD-10-CM | POA: Diagnosis not present

## 2014-11-15 DIAGNOSIS — E1143 Type 2 diabetes mellitus with diabetic autonomic (poly)neuropathy: Secondary | ICD-10-CM | POA: Diagnosis not present

## 2014-11-15 DIAGNOSIS — E039 Hypothyroidism, unspecified: Secondary | ICD-10-CM | POA: Diagnosis not present

## 2014-11-17 DIAGNOSIS — Z79899 Other long term (current) drug therapy: Secondary | ICD-10-CM | POA: Diagnosis not present

## 2014-11-17 DIAGNOSIS — Z1211 Encounter for screening for malignant neoplasm of colon: Secondary | ICD-10-CM | POA: Diagnosis not present

## 2014-12-01 ENCOUNTER — Ambulatory Visit
Admission: RE | Admit: 2014-12-01 | Discharge: 2014-12-01 | Disposition: A | Discharge status: Home / Self Care | Payer: Medicare Other | Source: Ambulatory Visit | Attending: Sports Medicine | Admitting: Sports Medicine

## 2014-12-01 DIAGNOSIS — M47816 Spondylosis without myelopathy or radiculopathy, lumbar region: Secondary | ICD-10-CM | POA: Diagnosis not present

## 2014-12-01 MED ORDER — METHYLPREDNISOLONE ACETATE 40 MG/ML INJ SUSP (RADIOLOG
120.0000 mg | Freq: Once | INTRAMUSCULAR | Status: AC
  Administered 2014-12-01: 120 mg via EPIDURAL

## 2014-12-01 MED ORDER — IOHEXOL 180 MG/ML  SOLN
1.0000 mL | Freq: Once | INTRAMUSCULAR | Status: DC | PRN
  Administered 2014-12-01: 1 mL via EPIDURAL

## 2014-12-01 MED ORDER — IOHEXOL 300 MG/ML  SOLN: 1.0000 mL | Freq: Once | INTRAMUSCULAR | Status: DC | PRN

## 2014-12-01 NOTE — Discharge Instructions (Signed)
Post Procedure Spinal Discharge Instruction Sheet  1. You may resume a regular diet and any medications that you routinely take (including pain medications).  2. No driving day of procedure.  3. Light activity throughout the rest of the day.  Do not do any strenuous work, exercise, bending or lifting.  The day following the procedure, you can resume normal physical activity but you should refrain from exercising or physical therapy for at least three days thereafter.   Common Side Effects:   Headaches- take your usual medications as directed by your physician.  Increase your fluid intake.  Caffeinated beverages may be helpful.  Lie flat in bed until your headache resolves.   Restlessness or inability to sleep- you may have trouble sleeping for the next few days.  Ask your referring physician if you need any medication for sleep.   Facial flushing or redness- should subside within a few days.   Increased pain- a temporary increase in pain a day or two following your procedure is not unusual.  Take your pain medication as prescribed by your referring physician.   Leg cramps  Please contact our office at [phone] for the following symptoms:  Fever greater than 100 degrees.  Headaches unresolved with medication after 2-3 days.  Increased swelling, pain, or redness at injection site.  Thank you for visiting our office.   MAY RESUME ELAQUIS TODAY.

## 2014-12-03 DIAGNOSIS — M5136 Other intervertebral disc degeneration, lumbar region: Secondary | ICD-10-CM | POA: Diagnosis not present

## 2014-12-08 DIAGNOSIS — Z23 Encounter for immunization: Secondary | ICD-10-CM | POA: Diagnosis not present

## 2014-12-18 DIAGNOSIS — I10 Essential (primary) hypertension: Secondary | ICD-10-CM | POA: Diagnosis not present

## 2014-12-18 DIAGNOSIS — I480 Paroxysmal atrial fibrillation: Secondary | ICD-10-CM | POA: Diagnosis not present

## 2014-12-18 DIAGNOSIS — I5022 Chronic systolic (congestive) heart failure: Secondary | ICD-10-CM | POA: Diagnosis not present

## 2014-12-18 DIAGNOSIS — I340 Nonrheumatic mitral (valve) insufficiency: Secondary | ICD-10-CM | POA: Diagnosis not present

## 2014-12-21 DIAGNOSIS — B351 Tinea unguium: Secondary | ICD-10-CM | POA: Diagnosis not present

## 2014-12-21 DIAGNOSIS — M2012 Hallux valgus (acquired), left foot: Secondary | ICD-10-CM | POA: Diagnosis not present

## 2014-12-21 DIAGNOSIS — M2011 Hallux valgus (acquired), right foot: Secondary | ICD-10-CM | POA: Diagnosis not present

## 2014-12-21 DIAGNOSIS — E119 Type 2 diabetes mellitus without complications: Secondary | ICD-10-CM | POA: Diagnosis not present

## 2014-12-25 ENCOUNTER — Telehealth: Payer: Self-pay

## 2014-12-25 DIAGNOSIS — M5136 Other intervertebral disc degeneration, lumbar region: Secondary | ICD-10-CM

## 2014-12-25 DIAGNOSIS — M51369 Other intervertebral disc degeneration, lumbar region without mention of lumbar back pain or lower extremity pain: Secondary | ICD-10-CM

## 2014-12-25 MED ORDER — GABAPENTIN 300 MG PO CAPS: 600.0000 mg | ORAL_CAPSULE | Freq: Every day | ORAL | Status: DC

## 2014-12-25 NOTE — Telephone Encounter (Signed)
Patient was unable to decrease her gabapentin to 1 tablet at night. She did reduce to 2 tablets at bedtime. She has now ran out of medication early and can not get a refill unless the prescription is changed to 2 qhs. Please advise.

## 2014-12-25 NOTE — Telephone Encounter (Signed)
Not a problem, refilling to 2 tabs daily at bedtime

## 2014-12-26 NOTE — Telephone Encounter (Signed)
Patient's granddaughter advised 

## 2014-12-27 DIAGNOSIS — Z1231 Encounter for screening mammogram for malignant neoplasm of breast: Secondary | ICD-10-CM | POA: Diagnosis not present

## 2015-01-02 DIAGNOSIS — M5136 Other intervertebral disc degeneration, lumbar region: Secondary | ICD-10-CM | POA: Diagnosis not present

## 2015-02-02 DIAGNOSIS — M5136 Other intervertebral disc degeneration, lumbar region: Secondary | ICD-10-CM | POA: Diagnosis not present

## 2015-03-01 ENCOUNTER — Ambulatory Visit: Payer: Medicare Other | Admitting: Sports Medicine

## 2015-03-02 ENCOUNTER — Encounter: Payer: Self-pay | Admitting: Sports Medicine

## 2015-03-02 ENCOUNTER — Ambulatory Visit (INDEPENDENT_AMBULATORY_CARE_PROVIDER_SITE_OTHER): Payer: Medicare Other | Admitting: Sports Medicine

## 2015-03-02 ENCOUNTER — Ambulatory Visit (INDEPENDENT_AMBULATORY_CARE_PROVIDER_SITE_OTHER): Payer: Medicare Other

## 2015-03-02 VITALS — BP 133/71 | HR 73

## 2015-03-02 DIAGNOSIS — M810 Age-related osteoporosis without current pathological fracture: Secondary | ICD-10-CM | POA: Diagnosis not present

## 2015-03-02 DIAGNOSIS — M5136 Other intervertebral disc degeneration, lumbar region: Secondary | ICD-10-CM | POA: Diagnosis not present

## 2015-03-02 DIAGNOSIS — M7732 Calcaneal spur, left foot: Secondary | ICD-10-CM

## 2015-03-02 DIAGNOSIS — M25572 Pain in left ankle and joints of left foot: Secondary | ICD-10-CM

## 2015-03-02 DIAGNOSIS — M51369 Other intervertebral disc degeneration, lumbar region without mention of lumbar back pain or lower extremity pain: Secondary | ICD-10-CM

## 2015-03-02 NOTE — Progress Notes (Signed)
  Subjective:    CC: Follow-up  HPI: Lumbar degenerative disc disease: Resolved after epidural, and with 2 gabapentin at bedtime.  Left ankle pain: Localize laterally, moderate, persistent without radiation.  Past medical history, Surgical history, Family history not pertinant except as noted below, Social history, Allergies, and medications have been entered into the medical record, reviewed, and no changes needed.   Review of Systems: No fevers, chills, night sweats, weight loss, chest pain, or shortness of breath.   Objective:    General: Well Developed, well nourished, and in no acute distress.  Neuro: Alert and oriented x3, extra-ocular muscles intact, sensation grossly intact.  HEENT: Normocephalic, atraumatic, pupils equal round reactive to light, neck supple, no masses, no lymphadenopathy, thyroid nonpalpable.  Skin: Warm and dry, no rashes. Cardiac: Regular rate and rhythm, no murmurs rubs or gallops, no lower extremity edema.  Respiratory: Clear to auscultation bilaterally. Not using accessory muscles, speaking in full sentences. Left Ankle: No visible erythema or swelling. Range of motion is full in all directions. Strength is 5/5 in all directions. Stable lateral and medial ligaments; squeeze test and kleiger test unremarkable; Talar dome nontender; No pain at base of 5th MT; No tenderness over cuboid; No tenderness over N spot or navicular prominence No tenderness on posterior aspects of lateral and medial malleolus No sign of peroneal tendon subluxations; Negative tarsal tunnel tinel's Minimal tenderness at the lateral mortise  Ankle was strapped with compressive dressing.  Impression and Recommendations:

## 2015-03-02 NOTE — Assessment & Plan Note (Signed)
X-rays, pain at the lateral mortise. Strapped with compressive dressing.

## 2015-03-02 NOTE — Assessment & Plan Note (Addendum)
Continues to do well after an L4-L5 interlaminar epidural and 2 gabapentin at bedtime.  Return as needed.

## 2015-03-15 DIAGNOSIS — R0989 Other specified symptoms and signs involving the circulatory and respiratory systems: Secondary | ICD-10-CM | POA: Diagnosis not present

## 2015-03-15 DIAGNOSIS — I340 Nonrheumatic mitral (valve) insufficiency: Secondary | ICD-10-CM | POA: Diagnosis not present

## 2015-03-15 DIAGNOSIS — I5022 Chronic systolic (congestive) heart failure: Secondary | ICD-10-CM | POA: Diagnosis not present

## 2015-03-15 DIAGNOSIS — I482 Chronic atrial fibrillation: Secondary | ICD-10-CM | POA: Diagnosis not present

## 2015-03-15 DIAGNOSIS — I10 Essential (primary) hypertension: Secondary | ICD-10-CM | POA: Diagnosis not present

## 2015-03-15 DIAGNOSIS — J9811 Atelectasis: Secondary | ICD-10-CM | POA: Diagnosis not present

## 2015-04-04 DIAGNOSIS — I482 Chronic atrial fibrillation: Secondary | ICD-10-CM | POA: Diagnosis not present

## 2015-04-04 DIAGNOSIS — I10 Essential (primary) hypertension: Secondary | ICD-10-CM | POA: Diagnosis not present

## 2015-04-04 DIAGNOSIS — I340 Nonrheumatic mitral (valve) insufficiency: Secondary | ICD-10-CM | POA: Diagnosis not present

## 2015-04-04 DIAGNOSIS — I5022 Chronic systolic (congestive) heart failure: Secondary | ICD-10-CM | POA: Diagnosis not present

## 2015-04-25 DIAGNOSIS — I5032 Chronic diastolic (congestive) heart failure: Secondary | ICD-10-CM | POA: Diagnosis not present

## 2015-04-25 DIAGNOSIS — I4891 Unspecified atrial fibrillation: Secondary | ICD-10-CM | POA: Diagnosis not present

## 2015-04-25 DIAGNOSIS — I482 Chronic atrial fibrillation: Secondary | ICD-10-CM | POA: Diagnosis not present

## 2015-04-25 DIAGNOSIS — I340 Nonrheumatic mitral (valve) insufficiency: Secondary | ICD-10-CM | POA: Diagnosis not present

## 2015-04-25 DIAGNOSIS — I110 Hypertensive heart disease with heart failure: Secondary | ICD-10-CM | POA: Diagnosis not present

## 2015-04-25 DIAGNOSIS — R9431 Abnormal electrocardiogram [ECG] [EKG]: Secondary | ICD-10-CM | POA: Diagnosis not present

## 2015-05-04 DIAGNOSIS — I4891 Unspecified atrial fibrillation: Secondary | ICD-10-CM | POA: Diagnosis not present

## 2015-05-08 DIAGNOSIS — R1314 Dysphagia, pharyngoesophageal phase: Secondary | ICD-10-CM | POA: Diagnosis not present

## 2015-05-10 DIAGNOSIS — E1143 Type 2 diabetes mellitus with diabetic autonomic (poly)neuropathy: Secondary | ICD-10-CM | POA: Diagnosis not present

## 2015-05-10 DIAGNOSIS — E7800 Pure hypercholesterolemia, unspecified: Secondary | ICD-10-CM | POA: Diagnosis not present

## 2015-05-10 DIAGNOSIS — E039 Hypothyroidism, unspecified: Secondary | ICD-10-CM | POA: Diagnosis not present

## 2015-05-10 DIAGNOSIS — I10 Essential (primary) hypertension: Secondary | ICD-10-CM | POA: Diagnosis not present

## 2015-05-11 DIAGNOSIS — Z1211 Encounter for screening for malignant neoplasm of colon: Secondary | ICD-10-CM | POA: Diagnosis not present

## 2015-05-11 DIAGNOSIS — Z79899 Other long term (current) drug therapy: Secondary | ICD-10-CM | POA: Diagnosis not present

## 2015-05-14 DIAGNOSIS — R0989 Other specified symptoms and signs involving the circulatory and respiratory systems: Secondary | ICD-10-CM | POA: Diagnosis not present

## 2015-05-14 DIAGNOSIS — I482 Chronic atrial fibrillation: Secondary | ICD-10-CM | POA: Diagnosis not present

## 2015-05-14 DIAGNOSIS — I340 Nonrheumatic mitral (valve) insufficiency: Secondary | ICD-10-CM | POA: Diagnosis not present

## 2015-05-14 DIAGNOSIS — I5032 Chronic diastolic (congestive) heart failure: Secondary | ICD-10-CM | POA: Diagnosis not present

## 2015-05-23 DIAGNOSIS — Z79899 Other long term (current) drug therapy: Secondary | ICD-10-CM | POA: Diagnosis not present

## 2015-05-24 DIAGNOSIS — I5032 Chronic diastolic (congestive) heart failure: Secondary | ICD-10-CM | POA: Diagnosis not present

## 2015-05-24 DIAGNOSIS — I10 Essential (primary) hypertension: Secondary | ICD-10-CM | POA: Diagnosis not present

## 2015-05-24 DIAGNOSIS — I340 Nonrheumatic mitral (valve) insufficiency: Secondary | ICD-10-CM | POA: Diagnosis not present

## 2015-05-24 DIAGNOSIS — I482 Chronic atrial fibrillation: Secondary | ICD-10-CM | POA: Diagnosis not present

## 2015-05-29 DIAGNOSIS — Z880 Allergy status to penicillin: Secondary | ICD-10-CM | POA: Diagnosis not present

## 2015-05-29 DIAGNOSIS — K449 Diaphragmatic hernia without obstruction or gangrene: Secondary | ICD-10-CM | POA: Diagnosis not present

## 2015-05-29 DIAGNOSIS — K219 Gastro-esophageal reflux disease without esophagitis: Secondary | ICD-10-CM | POA: Diagnosis not present

## 2015-05-29 DIAGNOSIS — I38 Endocarditis, valve unspecified: Secondary | ICD-10-CM | POA: Diagnosis not present

## 2015-05-29 DIAGNOSIS — I509 Heart failure, unspecified: Secondary | ICD-10-CM | POA: Diagnosis not present

## 2015-05-29 DIAGNOSIS — Q393 Congenital stenosis and stricture of esophagus: Secondary | ICD-10-CM | POA: Diagnosis not present

## 2015-05-29 DIAGNOSIS — Z79899 Other long term (current) drug therapy: Secondary | ICD-10-CM | POA: Diagnosis not present

## 2015-05-29 DIAGNOSIS — Z885 Allergy status to narcotic agent status: Secondary | ICD-10-CM | POA: Diagnosis not present

## 2015-05-29 DIAGNOSIS — R1314 Dysphagia, pharyngoesophageal phase: Secondary | ICD-10-CM | POA: Diagnosis not present

## 2015-05-29 DIAGNOSIS — I4891 Unspecified atrial fibrillation: Secondary | ICD-10-CM | POA: Diagnosis not present

## 2015-05-29 DIAGNOSIS — R1310 Dysphagia, unspecified: Secondary | ICD-10-CM | POA: Diagnosis not present

## 2015-05-29 DIAGNOSIS — H9190 Unspecified hearing loss, unspecified ear: Secondary | ICD-10-CM | POA: Diagnosis not present

## 2015-05-29 DIAGNOSIS — Z7901 Long term (current) use of anticoagulants: Secondary | ICD-10-CM | POA: Diagnosis not present

## 2015-05-29 DIAGNOSIS — M1990 Unspecified osteoarthritis, unspecified site: Secondary | ICD-10-CM | POA: Diagnosis not present

## 2015-05-29 DIAGNOSIS — E11618 Type 2 diabetes mellitus with other diabetic arthropathy: Secondary | ICD-10-CM | POA: Diagnosis not present

## 2015-05-29 DIAGNOSIS — M810 Age-related osteoporosis without current pathological fracture: Secondary | ICD-10-CM | POA: Diagnosis not present

## 2015-05-29 DIAGNOSIS — K2970 Gastritis, unspecified, without bleeding: Secondary | ICD-10-CM | POA: Diagnosis not present

## 2015-05-29 DIAGNOSIS — E039 Hypothyroidism, unspecified: Secondary | ICD-10-CM | POA: Diagnosis not present

## 2015-05-29 DIAGNOSIS — Z884 Allergy status to anesthetic agent status: Secondary | ICD-10-CM | POA: Diagnosis not present

## 2015-05-29 DIAGNOSIS — Z791 Long term (current) use of non-steroidal anti-inflammatories (NSAID): Secondary | ICD-10-CM | POA: Diagnosis not present

## 2015-05-29 DIAGNOSIS — Z882 Allergy status to sulfonamides status: Secondary | ICD-10-CM | POA: Diagnosis not present

## 2015-05-29 DIAGNOSIS — I10 Essential (primary) hypertension: Secondary | ICD-10-CM | POA: Diagnosis not present

## 2015-05-29 DIAGNOSIS — E119 Type 2 diabetes mellitus without complications: Secondary | ICD-10-CM | POA: Diagnosis not present

## 2015-06-10 ENCOUNTER — Other Ambulatory Visit: Payer: Self-pay | Admitting: Sports Medicine

## 2015-06-21 DIAGNOSIS — I10 Essential (primary) hypertension: Secondary | ICD-10-CM | POA: Diagnosis not present

## 2015-06-21 DIAGNOSIS — I5032 Chronic diastolic (congestive) heart failure: Secondary | ICD-10-CM | POA: Diagnosis not present

## 2015-06-21 DIAGNOSIS — I482 Chronic atrial fibrillation: Secondary | ICD-10-CM | POA: Diagnosis not present

## 2015-07-26 DIAGNOSIS — H524 Presbyopia: Secondary | ICD-10-CM | POA: Diagnosis not present

## 2015-07-26 DIAGNOSIS — H02403 Unspecified ptosis of bilateral eyelids: Secondary | ICD-10-CM | POA: Diagnosis not present

## 2015-08-14 DIAGNOSIS — K219 Gastro-esophageal reflux disease without esophagitis: Secondary | ICD-10-CM | POA: Diagnosis not present

## 2015-08-14 DIAGNOSIS — M79609 Pain in unspecified limb: Secondary | ICD-10-CM | POA: Diagnosis not present

## 2015-08-14 DIAGNOSIS — M810 Age-related osteoporosis without current pathological fracture: Secondary | ICD-10-CM | POA: Diagnosis not present

## 2015-08-14 DIAGNOSIS — R1030 Lower abdominal pain, unspecified: Secondary | ICD-10-CM | POA: Diagnosis not present

## 2015-08-14 DIAGNOSIS — Z880 Allergy status to penicillin: Secondary | ICD-10-CM | POA: Diagnosis not present

## 2015-08-14 DIAGNOSIS — Z882 Allergy status to sulfonamides status: Secondary | ICD-10-CM | POA: Diagnosis not present

## 2015-08-14 DIAGNOSIS — E039 Hypothyroidism, unspecified: Secondary | ICD-10-CM | POA: Diagnosis not present

## 2015-08-14 DIAGNOSIS — Z7901 Long term (current) use of anticoagulants: Secondary | ICD-10-CM | POA: Diagnosis not present

## 2015-08-14 DIAGNOSIS — M1990 Unspecified osteoarthritis, unspecified site: Secondary | ICD-10-CM | POA: Diagnosis not present

## 2015-08-14 DIAGNOSIS — M79651 Pain in right thigh: Secondary | ICD-10-CM | POA: Diagnosis not present

## 2015-08-14 DIAGNOSIS — I4891 Unspecified atrial fibrillation: Secondary | ICD-10-CM | POA: Diagnosis not present

## 2015-08-14 DIAGNOSIS — R1031 Right lower quadrant pain: Secondary | ICD-10-CM | POA: Diagnosis not present

## 2015-08-14 DIAGNOSIS — M25551 Pain in right hip: Secondary | ICD-10-CM | POA: Diagnosis not present

## 2015-08-14 DIAGNOSIS — Z79899 Other long term (current) drug therapy: Secondary | ICD-10-CM | POA: Diagnosis not present

## 2015-08-14 DIAGNOSIS — E119 Type 2 diabetes mellitus without complications: Secondary | ICD-10-CM | POA: Diagnosis not present

## 2015-08-14 DIAGNOSIS — Z791 Long term (current) use of non-steroidal anti-inflammatories (NSAID): Secondary | ICD-10-CM | POA: Diagnosis not present

## 2015-08-14 DIAGNOSIS — I10 Essential (primary) hypertension: Secondary | ICD-10-CM | POA: Diagnosis not present

## 2015-08-14 DIAGNOSIS — Z888 Allergy status to other drugs, medicaments and biological substances status: Secondary | ICD-10-CM | POA: Diagnosis not present

## 2015-08-20 ENCOUNTER — Encounter: Payer: Self-pay | Admitting: Sports Medicine

## 2015-08-20 ENCOUNTER — Ambulatory Visit (INDEPENDENT_AMBULATORY_CARE_PROVIDER_SITE_OTHER): Payer: Medicare Other | Admitting: Sports Medicine

## 2015-08-20 DIAGNOSIS — M1611 Unilateral primary osteoarthritis, right hip: Secondary | ICD-10-CM | POA: Diagnosis not present

## 2015-08-20 NOTE — Assessment & Plan Note (Signed)
Repeat right hip joint injection, she is homebound so we will also set her up for home health physical therapy.

## 2015-08-20 NOTE — Progress Notes (Signed)
  Subjective:    CC: Follow-up with right hip pain  HPI: This is a pleasant 82 year old female, she has fairly severe lumbar spondylosis, and right hip osteoarthritis that was injected about 2 years ago. 2 weeks ago she had a fall, was seen in the emergency department where x-rays of her hip were negative. She was referred to me for further evaluation and definitive treatment. Pain is localized in the groin with radiation to the thigh, and reproduced with hip flexion.  Past medical history, Surgical history, Family history not pertinant except as noted below, Social history, Allergies, and medications have been entered into the medical record, reviewed, and no changes needed.   Review of Systems: No fevers, chills, night sweats, weight loss, chest pain, or shortness of breath.   Objective:    General: Well Developed, well nourished, and in no acute distress.  Neuro: Alert and oriented x3, extra-ocular muscles intact, sensation grossly intact.  HEENT: Normocephalic, atraumatic, pupils equal round reactive to light, neck supple, no masses, no lymphadenopathy, thyroid nonpalpable.  Skin: Warm and dry, no rashes. Cardiac: Regular rate and rhythm, no murmurs rubs or gallops, no lower extremity edema.  Respiratory: Clear to auscultation bilaterally. Not using accessory muscles, speaking in full sentences. Right hip: Pain with internal rotation, and resisted flexion.  Procedure: Real-time Ultrasound Guided Injection of right hip joint Device: GE Logiq E  Verbal informed consent obtained.  Time-out conducted.  Noted no overlying erythema, induration, or other signs of local infection.  Skin prepped in a sterile fashion.  Local anesthesia: Topical Ethyl chloride.  With sterile technique and under real time ultrasound guidance:  Spinal needle advanced to the femoral head/neck junction, 1 mL kenalog 40, 2 mL lidocaine, 2 mL Marcaine injected easily. Completed without difficulty  Pain immediately  resolved suggesting accurate placement of the medication.  Advised to call if fevers/chills, erythema, induration, drainage, or persistent bleeding.  Images permanently stored and available for review in the ultrasound unit.  Impression: Technically successful ultrasound guided injection.  Impression and Recommendations:    I spent 25 minutes with this patient, greater than 50% was face-to-face time counseling regarding the above diagnoses, this time was separate from the time spent performing the procedure.

## 2015-08-22 DIAGNOSIS — S81802A Unspecified open wound, left lower leg, initial encounter: Secondary | ICD-10-CM | POA: Diagnosis not present

## 2015-08-22 DIAGNOSIS — I480 Paroxysmal atrial fibrillation: Secondary | ICD-10-CM | POA: Diagnosis not present

## 2015-08-22 DIAGNOSIS — E039 Hypothyroidism, unspecified: Secondary | ICD-10-CM | POA: Diagnosis not present

## 2015-08-22 DIAGNOSIS — M1611 Unilateral primary osteoarthritis, right hip: Secondary | ICD-10-CM | POA: Diagnosis not present

## 2015-08-22 DIAGNOSIS — I429 Cardiomyopathy, unspecified: Secondary | ICD-10-CM | POA: Diagnosis not present

## 2015-08-22 DIAGNOSIS — E11620 Type 2 diabetes mellitus with diabetic dermatitis: Secondary | ICD-10-CM | POA: Diagnosis not present

## 2015-08-22 DIAGNOSIS — I272 Other secondary pulmonary hypertension: Secondary | ICD-10-CM | POA: Diagnosis not present

## 2015-08-22 DIAGNOSIS — Z9181 History of falling: Secondary | ICD-10-CM | POA: Diagnosis not present

## 2015-08-22 DIAGNOSIS — I5022 Chronic systolic (congestive) heart failure: Secondary | ICD-10-CM | POA: Diagnosis not present

## 2015-08-22 DIAGNOSIS — M7061 Trochanteric bursitis, right hip: Secondary | ICD-10-CM | POA: Diagnosis not present

## 2015-08-22 DIAGNOSIS — M5136 Other intervertebral disc degeneration, lumbar region: Secondary | ICD-10-CM | POA: Diagnosis not present

## 2015-08-22 DIAGNOSIS — I110 Hypertensive heart disease with heart failure: Secondary | ICD-10-CM | POA: Diagnosis not present

## 2015-08-24 DIAGNOSIS — I480 Paroxysmal atrial fibrillation: Secondary | ICD-10-CM | POA: Diagnosis not present

## 2015-08-24 DIAGNOSIS — M5136 Other intervertebral disc degeneration, lumbar region: Secondary | ICD-10-CM | POA: Diagnosis not present

## 2015-08-24 DIAGNOSIS — E11620 Type 2 diabetes mellitus with diabetic dermatitis: Secondary | ICD-10-CM | POA: Diagnosis not present

## 2015-08-24 DIAGNOSIS — Z9181 History of falling: Secondary | ICD-10-CM | POA: Diagnosis not present

## 2015-08-24 DIAGNOSIS — S81802A Unspecified open wound, left lower leg, initial encounter: Secondary | ICD-10-CM | POA: Diagnosis not present

## 2015-08-24 DIAGNOSIS — M7061 Trochanteric bursitis, right hip: Secondary | ICD-10-CM | POA: Diagnosis not present

## 2015-08-24 DIAGNOSIS — I5022 Chronic systolic (congestive) heart failure: Secondary | ICD-10-CM | POA: Diagnosis not present

## 2015-08-24 DIAGNOSIS — I272 Other secondary pulmonary hypertension: Secondary | ICD-10-CM | POA: Diagnosis not present

## 2015-08-24 DIAGNOSIS — I110 Hypertensive heart disease with heart failure: Secondary | ICD-10-CM | POA: Diagnosis not present

## 2015-08-24 DIAGNOSIS — M1611 Unilateral primary osteoarthritis, right hip: Secondary | ICD-10-CM | POA: Diagnosis not present

## 2015-08-24 DIAGNOSIS — I429 Cardiomyopathy, unspecified: Secondary | ICD-10-CM | POA: Diagnosis not present

## 2015-08-24 DIAGNOSIS — E039 Hypothyroidism, unspecified: Secondary | ICD-10-CM | POA: Diagnosis not present

## 2015-08-29 DIAGNOSIS — I480 Paroxysmal atrial fibrillation: Secondary | ICD-10-CM | POA: Diagnosis not present

## 2015-08-29 DIAGNOSIS — Z9181 History of falling: Secondary | ICD-10-CM | POA: Diagnosis not present

## 2015-08-29 DIAGNOSIS — S81802A Unspecified open wound, left lower leg, initial encounter: Secondary | ICD-10-CM | POA: Diagnosis not present

## 2015-08-29 DIAGNOSIS — E11620 Type 2 diabetes mellitus with diabetic dermatitis: Secondary | ICD-10-CM | POA: Diagnosis not present

## 2015-08-29 DIAGNOSIS — I272 Other secondary pulmonary hypertension: Secondary | ICD-10-CM | POA: Diagnosis not present

## 2015-08-29 DIAGNOSIS — M1611 Unilateral primary osteoarthritis, right hip: Secondary | ICD-10-CM | POA: Diagnosis not present

## 2015-08-29 DIAGNOSIS — M7061 Trochanteric bursitis, right hip: Secondary | ICD-10-CM | POA: Diagnosis not present

## 2015-08-29 DIAGNOSIS — I5022 Chronic systolic (congestive) heart failure: Secondary | ICD-10-CM | POA: Diagnosis not present

## 2015-08-29 DIAGNOSIS — I429 Cardiomyopathy, unspecified: Secondary | ICD-10-CM | POA: Diagnosis not present

## 2015-08-29 DIAGNOSIS — I110 Hypertensive heart disease with heart failure: Secondary | ICD-10-CM | POA: Diagnosis not present

## 2015-08-29 DIAGNOSIS — E039 Hypothyroidism, unspecified: Secondary | ICD-10-CM | POA: Diagnosis not present

## 2015-08-29 DIAGNOSIS — M5136 Other intervertebral disc degeneration, lumbar region: Secondary | ICD-10-CM | POA: Diagnosis not present

## 2015-08-31 DIAGNOSIS — I5022 Chronic systolic (congestive) heart failure: Secondary | ICD-10-CM | POA: Diagnosis not present

## 2015-08-31 DIAGNOSIS — E039 Hypothyroidism, unspecified: Secondary | ICD-10-CM | POA: Diagnosis not present

## 2015-08-31 DIAGNOSIS — I272 Other secondary pulmonary hypertension: Secondary | ICD-10-CM | POA: Diagnosis not present

## 2015-08-31 DIAGNOSIS — I110 Hypertensive heart disease with heart failure: Secondary | ICD-10-CM | POA: Diagnosis not present

## 2015-08-31 DIAGNOSIS — Z9181 History of falling: Secondary | ICD-10-CM | POA: Diagnosis not present

## 2015-08-31 DIAGNOSIS — I429 Cardiomyopathy, unspecified: Secondary | ICD-10-CM | POA: Diagnosis not present

## 2015-08-31 DIAGNOSIS — S81802A Unspecified open wound, left lower leg, initial encounter: Secondary | ICD-10-CM | POA: Diagnosis not present

## 2015-08-31 DIAGNOSIS — M5136 Other intervertebral disc degeneration, lumbar region: Secondary | ICD-10-CM | POA: Diagnosis not present

## 2015-08-31 DIAGNOSIS — I480 Paroxysmal atrial fibrillation: Secondary | ICD-10-CM | POA: Diagnosis not present

## 2015-08-31 DIAGNOSIS — M1611 Unilateral primary osteoarthritis, right hip: Secondary | ICD-10-CM | POA: Diagnosis not present

## 2015-08-31 DIAGNOSIS — M7061 Trochanteric bursitis, right hip: Secondary | ICD-10-CM | POA: Diagnosis not present

## 2015-08-31 DIAGNOSIS — E11620 Type 2 diabetes mellitus with diabetic dermatitis: Secondary | ICD-10-CM | POA: Diagnosis not present

## 2015-09-03 DIAGNOSIS — M5136 Other intervertebral disc degeneration, lumbar region: Secondary | ICD-10-CM | POA: Diagnosis not present

## 2015-09-03 DIAGNOSIS — S81802A Unspecified open wound, left lower leg, initial encounter: Secondary | ICD-10-CM | POA: Diagnosis not present

## 2015-09-03 DIAGNOSIS — I110 Hypertensive heart disease with heart failure: Secondary | ICD-10-CM | POA: Diagnosis not present

## 2015-09-03 DIAGNOSIS — I480 Paroxysmal atrial fibrillation: Secondary | ICD-10-CM | POA: Diagnosis not present

## 2015-09-03 DIAGNOSIS — I5022 Chronic systolic (congestive) heart failure: Secondary | ICD-10-CM | POA: Diagnosis not present

## 2015-09-03 DIAGNOSIS — M7061 Trochanteric bursitis, right hip: Secondary | ICD-10-CM | POA: Diagnosis not present

## 2015-09-03 DIAGNOSIS — Z9181 History of falling: Secondary | ICD-10-CM | POA: Diagnosis not present

## 2015-09-03 DIAGNOSIS — M1611 Unilateral primary osteoarthritis, right hip: Secondary | ICD-10-CM | POA: Diagnosis not present

## 2015-09-03 DIAGNOSIS — E11620 Type 2 diabetes mellitus with diabetic dermatitis: Secondary | ICD-10-CM | POA: Diagnosis not present

## 2015-09-03 DIAGNOSIS — E039 Hypothyroidism, unspecified: Secondary | ICD-10-CM | POA: Diagnosis not present

## 2015-09-03 DIAGNOSIS — I429 Cardiomyopathy, unspecified: Secondary | ICD-10-CM | POA: Diagnosis not present

## 2015-09-03 DIAGNOSIS — I272 Other secondary pulmonary hypertension: Secondary | ICD-10-CM | POA: Diagnosis not present

## 2015-09-04 DIAGNOSIS — M5136 Other intervertebral disc degeneration, lumbar region: Secondary | ICD-10-CM | POA: Diagnosis not present

## 2015-09-04 DIAGNOSIS — E039 Hypothyroidism, unspecified: Secondary | ICD-10-CM | POA: Diagnosis not present

## 2015-09-04 DIAGNOSIS — E11620 Type 2 diabetes mellitus with diabetic dermatitis: Secondary | ICD-10-CM | POA: Diagnosis not present

## 2015-09-04 DIAGNOSIS — I272 Other secondary pulmonary hypertension: Secondary | ICD-10-CM | POA: Diagnosis not present

## 2015-09-04 DIAGNOSIS — M7061 Trochanteric bursitis, right hip: Secondary | ICD-10-CM | POA: Diagnosis not present

## 2015-09-04 DIAGNOSIS — I110 Hypertensive heart disease with heart failure: Secondary | ICD-10-CM | POA: Diagnosis not present

## 2015-09-04 DIAGNOSIS — M1611 Unilateral primary osteoarthritis, right hip: Secondary | ICD-10-CM | POA: Diagnosis not present

## 2015-09-04 DIAGNOSIS — I5022 Chronic systolic (congestive) heart failure: Secondary | ICD-10-CM | POA: Diagnosis not present

## 2015-09-04 DIAGNOSIS — Z9181 History of falling: Secondary | ICD-10-CM | POA: Diagnosis not present

## 2015-09-04 DIAGNOSIS — S81802A Unspecified open wound, left lower leg, initial encounter: Secondary | ICD-10-CM | POA: Diagnosis not present

## 2015-09-04 DIAGNOSIS — I480 Paroxysmal atrial fibrillation: Secondary | ICD-10-CM | POA: Diagnosis not present

## 2015-09-04 DIAGNOSIS — I429 Cardiomyopathy, unspecified: Secondary | ICD-10-CM | POA: Diagnosis not present

## 2015-09-05 DIAGNOSIS — I272 Other secondary pulmonary hypertension: Secondary | ICD-10-CM | POA: Diagnosis not present

## 2015-09-05 DIAGNOSIS — E039 Hypothyroidism, unspecified: Secondary | ICD-10-CM | POA: Diagnosis not present

## 2015-09-05 DIAGNOSIS — I5022 Chronic systolic (congestive) heart failure: Secondary | ICD-10-CM | POA: Diagnosis not present

## 2015-09-05 DIAGNOSIS — S81802A Unspecified open wound, left lower leg, initial encounter: Secondary | ICD-10-CM | POA: Diagnosis not present

## 2015-09-05 DIAGNOSIS — M1611 Unilateral primary osteoarthritis, right hip: Secondary | ICD-10-CM | POA: Diagnosis not present

## 2015-09-05 DIAGNOSIS — I480 Paroxysmal atrial fibrillation: Secondary | ICD-10-CM | POA: Diagnosis not present

## 2015-09-05 DIAGNOSIS — M7061 Trochanteric bursitis, right hip: Secondary | ICD-10-CM | POA: Diagnosis not present

## 2015-09-05 DIAGNOSIS — E11620 Type 2 diabetes mellitus with diabetic dermatitis: Secondary | ICD-10-CM | POA: Diagnosis not present

## 2015-09-05 DIAGNOSIS — I110 Hypertensive heart disease with heart failure: Secondary | ICD-10-CM | POA: Diagnosis not present

## 2015-09-05 DIAGNOSIS — Z9181 History of falling: Secondary | ICD-10-CM | POA: Diagnosis not present

## 2015-09-05 DIAGNOSIS — M5136 Other intervertebral disc degeneration, lumbar region: Secondary | ICD-10-CM | POA: Diagnosis not present

## 2015-09-05 DIAGNOSIS — I429 Cardiomyopathy, unspecified: Secondary | ICD-10-CM | POA: Diagnosis not present

## 2015-09-07 DIAGNOSIS — I5022 Chronic systolic (congestive) heart failure: Secondary | ICD-10-CM | POA: Diagnosis not present

## 2015-09-07 DIAGNOSIS — Z9181 History of falling: Secondary | ICD-10-CM | POA: Diagnosis not present

## 2015-09-07 DIAGNOSIS — M5136 Other intervertebral disc degeneration, lumbar region: Secondary | ICD-10-CM | POA: Diagnosis not present

## 2015-09-07 DIAGNOSIS — M1611 Unilateral primary osteoarthritis, right hip: Secondary | ICD-10-CM | POA: Diagnosis not present

## 2015-09-07 DIAGNOSIS — E039 Hypothyroidism, unspecified: Secondary | ICD-10-CM | POA: Diagnosis not present

## 2015-09-07 DIAGNOSIS — E11620 Type 2 diabetes mellitus with diabetic dermatitis: Secondary | ICD-10-CM | POA: Diagnosis not present

## 2015-09-07 DIAGNOSIS — I480 Paroxysmal atrial fibrillation: Secondary | ICD-10-CM | POA: Diagnosis not present

## 2015-09-07 DIAGNOSIS — M7061 Trochanteric bursitis, right hip: Secondary | ICD-10-CM | POA: Diagnosis not present

## 2015-09-07 DIAGNOSIS — S81802A Unspecified open wound, left lower leg, initial encounter: Secondary | ICD-10-CM | POA: Diagnosis not present

## 2015-09-07 DIAGNOSIS — I110 Hypertensive heart disease with heart failure: Secondary | ICD-10-CM | POA: Diagnosis not present

## 2015-09-07 DIAGNOSIS — I272 Other secondary pulmonary hypertension: Secondary | ICD-10-CM | POA: Diagnosis not present

## 2015-09-07 DIAGNOSIS — I429 Cardiomyopathy, unspecified: Secondary | ICD-10-CM | POA: Diagnosis not present

## 2015-09-11 ENCOUNTER — Telehealth: Payer: Self-pay | Admitting: Family Medicine

## 2015-09-11 DIAGNOSIS — E039 Hypothyroidism, unspecified: Secondary | ICD-10-CM | POA: Diagnosis not present

## 2015-09-11 DIAGNOSIS — I429 Cardiomyopathy, unspecified: Secondary | ICD-10-CM | POA: Diagnosis not present

## 2015-09-11 DIAGNOSIS — I110 Hypertensive heart disease with heart failure: Secondary | ICD-10-CM | POA: Diagnosis not present

## 2015-09-11 DIAGNOSIS — Z9181 History of falling: Secondary | ICD-10-CM | POA: Diagnosis not present

## 2015-09-11 DIAGNOSIS — M5136 Other intervertebral disc degeneration, lumbar region: Secondary | ICD-10-CM | POA: Diagnosis not present

## 2015-09-11 DIAGNOSIS — I480 Paroxysmal atrial fibrillation: Secondary | ICD-10-CM | POA: Diagnosis not present

## 2015-09-11 DIAGNOSIS — M7061 Trochanteric bursitis, right hip: Secondary | ICD-10-CM | POA: Diagnosis not present

## 2015-09-11 DIAGNOSIS — M1611 Unilateral primary osteoarthritis, right hip: Secondary | ICD-10-CM | POA: Diagnosis not present

## 2015-09-11 DIAGNOSIS — E11620 Type 2 diabetes mellitus with diabetic dermatitis: Secondary | ICD-10-CM | POA: Diagnosis not present

## 2015-09-11 DIAGNOSIS — I272 Other secondary pulmonary hypertension: Secondary | ICD-10-CM | POA: Diagnosis not present

## 2015-09-11 DIAGNOSIS — S81802A Unspecified open wound, left lower leg, initial encounter: Secondary | ICD-10-CM | POA: Diagnosis not present

## 2015-09-11 DIAGNOSIS — I5022 Chronic systolic (congestive) heart failure: Secondary | ICD-10-CM | POA: Diagnosis not present

## 2015-09-11 NOTE — Telephone Encounter (Signed)
Vernona RiegerLaura with [facility] called  & she is requesting new Orders on this patient.  Phone # [phone].  Thanks!

## 2015-09-11 NOTE — Telephone Encounter (Signed)
Spoke with Laura

## 2015-09-12 DIAGNOSIS — I429 Cardiomyopathy, unspecified: Secondary | ICD-10-CM | POA: Diagnosis not present

## 2015-09-12 DIAGNOSIS — I110 Hypertensive heart disease with heart failure: Secondary | ICD-10-CM | POA: Diagnosis not present

## 2015-09-12 DIAGNOSIS — M1611 Unilateral primary osteoarthritis, right hip: Secondary | ICD-10-CM | POA: Diagnosis not present

## 2015-09-12 DIAGNOSIS — E11620 Type 2 diabetes mellitus with diabetic dermatitis: Secondary | ICD-10-CM | POA: Diagnosis not present

## 2015-09-12 DIAGNOSIS — I480 Paroxysmal atrial fibrillation: Secondary | ICD-10-CM | POA: Diagnosis not present

## 2015-09-12 DIAGNOSIS — I272 Other secondary pulmonary hypertension: Secondary | ICD-10-CM | POA: Diagnosis not present

## 2015-09-12 DIAGNOSIS — M5136 Other intervertebral disc degeneration, lumbar region: Secondary | ICD-10-CM | POA: Diagnosis not present

## 2015-09-12 DIAGNOSIS — M7061 Trochanteric bursitis, right hip: Secondary | ICD-10-CM | POA: Diagnosis not present

## 2015-09-12 DIAGNOSIS — I5022 Chronic systolic (congestive) heart failure: Secondary | ICD-10-CM | POA: Diagnosis not present

## 2015-09-12 DIAGNOSIS — Z9181 History of falling: Secondary | ICD-10-CM | POA: Diagnosis not present

## 2015-09-12 DIAGNOSIS — E039 Hypothyroidism, unspecified: Secondary | ICD-10-CM | POA: Diagnosis not present

## 2015-09-12 DIAGNOSIS — S81802A Unspecified open wound, left lower leg, initial encounter: Secondary | ICD-10-CM | POA: Diagnosis not present

## 2015-09-14 DIAGNOSIS — I110 Hypertensive heart disease with heart failure: Secondary | ICD-10-CM | POA: Diagnosis not present

## 2015-09-14 DIAGNOSIS — I429 Cardiomyopathy, unspecified: Secondary | ICD-10-CM | POA: Diagnosis not present

## 2015-09-14 DIAGNOSIS — E11620 Type 2 diabetes mellitus with diabetic dermatitis: Secondary | ICD-10-CM | POA: Diagnosis not present

## 2015-09-14 DIAGNOSIS — I480 Paroxysmal atrial fibrillation: Secondary | ICD-10-CM | POA: Diagnosis not present

## 2015-09-14 DIAGNOSIS — M7061 Trochanteric bursitis, right hip: Secondary | ICD-10-CM | POA: Diagnosis not present

## 2015-09-14 DIAGNOSIS — M1611 Unilateral primary osteoarthritis, right hip: Secondary | ICD-10-CM | POA: Diagnosis not present

## 2015-09-14 DIAGNOSIS — M5136 Other intervertebral disc degeneration, lumbar region: Secondary | ICD-10-CM | POA: Diagnosis not present

## 2015-09-14 DIAGNOSIS — Z9181 History of falling: Secondary | ICD-10-CM | POA: Diagnosis not present

## 2015-09-14 DIAGNOSIS — I272 Other secondary pulmonary hypertension: Secondary | ICD-10-CM | POA: Diagnosis not present

## 2015-09-14 DIAGNOSIS — E039 Hypothyroidism, unspecified: Secondary | ICD-10-CM | POA: Diagnosis not present

## 2015-09-14 DIAGNOSIS — I5022 Chronic systolic (congestive) heart failure: Secondary | ICD-10-CM | POA: Diagnosis not present

## 2015-09-14 DIAGNOSIS — S81802A Unspecified open wound, left lower leg, initial encounter: Secondary | ICD-10-CM | POA: Diagnosis not present

## 2015-09-17 ENCOUNTER — Ambulatory Visit: Payer: Medicare Other | Admitting: Sports Medicine

## 2015-09-18 DIAGNOSIS — E039 Hypothyroidism, unspecified: Secondary | ICD-10-CM | POA: Diagnosis not present

## 2015-09-18 DIAGNOSIS — I5022 Chronic systolic (congestive) heart failure: Secondary | ICD-10-CM | POA: Diagnosis not present

## 2015-09-18 DIAGNOSIS — M7061 Trochanteric bursitis, right hip: Secondary | ICD-10-CM | POA: Diagnosis not present

## 2015-09-18 DIAGNOSIS — E11620 Type 2 diabetes mellitus with diabetic dermatitis: Secondary | ICD-10-CM | POA: Diagnosis not present

## 2015-09-18 DIAGNOSIS — M5136 Other intervertebral disc degeneration, lumbar region: Secondary | ICD-10-CM | POA: Diagnosis not present

## 2015-09-18 DIAGNOSIS — S81802A Unspecified open wound, left lower leg, initial encounter: Secondary | ICD-10-CM | POA: Diagnosis not present

## 2015-09-18 DIAGNOSIS — I480 Paroxysmal atrial fibrillation: Secondary | ICD-10-CM | POA: Diagnosis not present

## 2015-09-18 DIAGNOSIS — I429 Cardiomyopathy, unspecified: Secondary | ICD-10-CM | POA: Diagnosis not present

## 2015-09-18 DIAGNOSIS — Z9181 History of falling: Secondary | ICD-10-CM | POA: Diagnosis not present

## 2015-09-18 DIAGNOSIS — I272 Other secondary pulmonary hypertension: Secondary | ICD-10-CM | POA: Diagnosis not present

## 2015-09-18 DIAGNOSIS — M1611 Unilateral primary osteoarthritis, right hip: Secondary | ICD-10-CM | POA: Diagnosis not present

## 2015-09-18 DIAGNOSIS — I110 Hypertensive heart disease with heart failure: Secondary | ICD-10-CM | POA: Diagnosis not present

## 2015-09-20 DIAGNOSIS — E039 Hypothyroidism, unspecified: Secondary | ICD-10-CM | POA: Diagnosis not present

## 2015-09-20 DIAGNOSIS — I429 Cardiomyopathy, unspecified: Secondary | ICD-10-CM | POA: Diagnosis not present

## 2015-09-20 DIAGNOSIS — I480 Paroxysmal atrial fibrillation: Secondary | ICD-10-CM | POA: Diagnosis not present

## 2015-09-20 DIAGNOSIS — I272 Other secondary pulmonary hypertension: Secondary | ICD-10-CM | POA: Diagnosis not present

## 2015-09-20 DIAGNOSIS — M1611 Unilateral primary osteoarthritis, right hip: Secondary | ICD-10-CM | POA: Diagnosis not present

## 2015-09-20 DIAGNOSIS — M5136 Other intervertebral disc degeneration, lumbar region: Secondary | ICD-10-CM | POA: Diagnosis not present

## 2015-09-20 DIAGNOSIS — S81802A Unspecified open wound, left lower leg, initial encounter: Secondary | ICD-10-CM | POA: Diagnosis not present

## 2015-09-20 DIAGNOSIS — I5022 Chronic systolic (congestive) heart failure: Secondary | ICD-10-CM | POA: Diagnosis not present

## 2015-09-20 DIAGNOSIS — Z9181 History of falling: Secondary | ICD-10-CM | POA: Diagnosis not present

## 2015-09-20 DIAGNOSIS — I110 Hypertensive heart disease with heart failure: Secondary | ICD-10-CM | POA: Diagnosis not present

## 2015-09-20 DIAGNOSIS — E11620 Type 2 diabetes mellitus with diabetic dermatitis: Secondary | ICD-10-CM | POA: Diagnosis not present

## 2015-09-20 DIAGNOSIS — M7061 Trochanteric bursitis, right hip: Secondary | ICD-10-CM | POA: Diagnosis not present

## 2015-09-21 DIAGNOSIS — I429 Cardiomyopathy, unspecified: Secondary | ICD-10-CM | POA: Diagnosis not present

## 2015-09-21 DIAGNOSIS — M1611 Unilateral primary osteoarthritis, right hip: Secondary | ICD-10-CM | POA: Diagnosis not present

## 2015-09-21 DIAGNOSIS — E11620 Type 2 diabetes mellitus with diabetic dermatitis: Secondary | ICD-10-CM | POA: Diagnosis not present

## 2015-09-21 DIAGNOSIS — E039 Hypothyroidism, unspecified: Secondary | ICD-10-CM | POA: Diagnosis not present

## 2015-09-21 DIAGNOSIS — I110 Hypertensive heart disease with heart failure: Secondary | ICD-10-CM | POA: Diagnosis not present

## 2015-09-21 DIAGNOSIS — I5022 Chronic systolic (congestive) heart failure: Secondary | ICD-10-CM | POA: Diagnosis not present

## 2015-09-21 DIAGNOSIS — S81802A Unspecified open wound, left lower leg, initial encounter: Secondary | ICD-10-CM | POA: Diagnosis not present

## 2015-09-21 DIAGNOSIS — I272 Other secondary pulmonary hypertension: Secondary | ICD-10-CM | POA: Diagnosis not present

## 2015-09-21 DIAGNOSIS — M5136 Other intervertebral disc degeneration, lumbar region: Secondary | ICD-10-CM | POA: Diagnosis not present

## 2015-09-21 DIAGNOSIS — Z9181 History of falling: Secondary | ICD-10-CM | POA: Diagnosis not present

## 2015-09-21 DIAGNOSIS — M7061 Trochanteric bursitis, right hip: Secondary | ICD-10-CM | POA: Diagnosis not present

## 2015-09-21 DIAGNOSIS — I480 Paroxysmal atrial fibrillation: Secondary | ICD-10-CM | POA: Diagnosis not present

## 2015-09-24 DIAGNOSIS — I110 Hypertensive heart disease with heart failure: Secondary | ICD-10-CM | POA: Diagnosis not present

## 2015-09-24 DIAGNOSIS — M5136 Other intervertebral disc degeneration, lumbar region: Secondary | ICD-10-CM | POA: Diagnosis not present

## 2015-09-24 DIAGNOSIS — Z9181 History of falling: Secondary | ICD-10-CM | POA: Diagnosis not present

## 2015-09-24 DIAGNOSIS — S81802A Unspecified open wound, left lower leg, initial encounter: Secondary | ICD-10-CM | POA: Diagnosis not present

## 2015-09-24 DIAGNOSIS — M1611 Unilateral primary osteoarthritis, right hip: Secondary | ICD-10-CM | POA: Diagnosis not present

## 2015-09-24 DIAGNOSIS — I5032 Chronic diastolic (congestive) heart failure: Secondary | ICD-10-CM | POA: Diagnosis not present

## 2015-09-24 DIAGNOSIS — E11620 Type 2 diabetes mellitus with diabetic dermatitis: Secondary | ICD-10-CM | POA: Diagnosis not present

## 2015-09-24 DIAGNOSIS — I10 Essential (primary) hypertension: Secondary | ICD-10-CM | POA: Diagnosis not present

## 2015-09-24 DIAGNOSIS — I429 Cardiomyopathy, unspecified: Secondary | ICD-10-CM | POA: Diagnosis not present

## 2015-09-24 DIAGNOSIS — E039 Hypothyroidism, unspecified: Secondary | ICD-10-CM | POA: Diagnosis not present

## 2015-09-24 DIAGNOSIS — I480 Paroxysmal atrial fibrillation: Secondary | ICD-10-CM | POA: Diagnosis not present

## 2015-09-24 DIAGNOSIS — M7061 Trochanteric bursitis, right hip: Secondary | ICD-10-CM | POA: Diagnosis not present

## 2015-09-24 DIAGNOSIS — I482 Chronic atrial fibrillation: Secondary | ICD-10-CM | POA: Diagnosis not present

## 2015-09-24 DIAGNOSIS — I5022 Chronic systolic (congestive) heart failure: Secondary | ICD-10-CM | POA: Diagnosis not present

## 2015-09-24 DIAGNOSIS — I272 Other secondary pulmonary hypertension: Secondary | ICD-10-CM | POA: Diagnosis not present

## 2015-09-26 ENCOUNTER — Ambulatory Visit (INDEPENDENT_AMBULATORY_CARE_PROVIDER_SITE_OTHER): Payer: Medicare Other

## 2015-09-26 ENCOUNTER — Ambulatory Visit (INDEPENDENT_AMBULATORY_CARE_PROVIDER_SITE_OTHER): Payer: Medicare Other | Admitting: Family Medicine

## 2015-09-26 ENCOUNTER — Encounter: Payer: Self-pay | Admitting: Family Medicine

## 2015-09-26 VITALS — BP 102/68 | HR 68

## 2015-09-26 DIAGNOSIS — I708 Atherosclerosis of other arteries: Secondary | ICD-10-CM | POA: Diagnosis not present

## 2015-09-26 DIAGNOSIS — S6991XA Unspecified injury of right wrist, hand and finger(s), initial encounter: Secondary | ICD-10-CM

## 2015-09-26 DIAGNOSIS — G319 Degenerative disease of nervous system, unspecified: Secondary | ICD-10-CM

## 2015-09-26 DIAGNOSIS — W19XXXA Unspecified fall, initial encounter: Secondary | ICD-10-CM | POA: Diagnosis not present

## 2015-09-26 DIAGNOSIS — S79921A Unspecified injury of right thigh, initial encounter: Secondary | ICD-10-CM | POA: Diagnosis not present

## 2015-09-26 DIAGNOSIS — S0990XA Unspecified injury of head, initial encounter: Secondary | ICD-10-CM

## 2015-09-26 DIAGNOSIS — M79661 Pain in right lower leg: Secondary | ICD-10-CM | POA: Diagnosis not present

## 2015-09-26 DIAGNOSIS — S8991XA Unspecified injury of right lower leg, initial encounter: Secondary | ICD-10-CM | POA: Diagnosis not present

## 2015-09-26 DIAGNOSIS — M85831 Other specified disorders of bone density and structure, right forearm: Secondary | ICD-10-CM | POA: Diagnosis not present

## 2015-09-26 DIAGNOSIS — M79604 Pain in right leg: Secondary | ICD-10-CM | POA: Diagnosis not present

## 2015-09-26 DIAGNOSIS — J324 Chronic pansinusitis: Secondary | ICD-10-CM | POA: Diagnosis not present

## 2015-09-26 DIAGNOSIS — M25531 Pain in right wrist: Secondary | ICD-10-CM | POA: Diagnosis not present

## 2015-09-26 DIAGNOSIS — R51 Headache: Secondary | ICD-10-CM | POA: Diagnosis not present

## 2015-09-26 NOTE — Progress Notes (Signed)
Note routed to PCP Loleta DickerErin Judge, FNP via [employer].

## 2015-09-26 NOTE — Patient Instructions (Signed)
Thank you for coming in today. Follow up with Dr T next week for splint change.  Make sure to have 24 hour care at home.  Take tylenol 650mg  every 6-8 hours for pain.   Cast or Splint Care Casts and splints support injured limbs and keep bones from moving while they heal. It is important to care for your cast or splint at home.  HOME CARE INSTRUCTIONS  Keep the cast or splint uncovered during the drying period. It can take 24 to 48 hours to dry if it is made of plaster. A fiberglass cast will dry in less than 1 hour.  Do not rest the cast on anything harder than a pillow for the first 24 hours.  Do not put weight on your injured limb or apply pressure to the cast until your health care provider gives you permission.  Keep the cast or splint dry. Wet casts or splints can lose their shape and may not support the limb as well. A wet cast that has lost its shape can also create harmful pressure on your skin when it dries. Also, wet skin can become infected.  Cover the cast or splint with a plastic bag when bathing or when out in the rain or snow. If the cast is on the trunk of the body, take sponge baths until the cast is removed.  If your cast does become wet, dry it with a towel or a blow dryer on the cool setting only.  Keep your cast or splint clean. Soiled casts may be wiped with a moistened cloth.  Do not place any hard or soft foreign objects under your cast or splint, such as cotton, toilet paper, lotion, or powder.  Do not try to scratch the skin under the cast with any object. The object could get stuck inside the cast. Also, scratching could lead to an infection. If itching is a problem, use a blow dryer on a cool setting to relieve discomfort.  Do not trim or cut your cast or remove padding from inside of it.  Exercise all joints next to the injury that are not immobilized by the cast or splint. For example, if you have a long leg cast, exercise the hip joint and toes. If you  have an arm cast or splint, exercise the shoulder, elbow, thumb, and fingers.  Elevate your injured arm or leg on 1 or 2 pillows for the first 1 to 3 days to decrease swelling and pain.It is best if you can comfortably elevate your cast so it is higher than your heart. SEEK MEDICAL CARE IF:   Your cast or splint cracks.  Your cast or splint is too tight or too loose.  You have unbearable itching inside the cast.  Your cast becomes wet or develops a soft spot or area.  You have a bad smell coming from inside your cast.  You get an object stuck under your cast.  Your skin around the cast becomes red or raw.  You have new pain or worsening pain after the cast has been applied. SEEK IMMEDIATE MEDICAL CARE IF:   You have fluid leaking through the cast.  You are unable to move your fingers or toes.  You have discolored (blue or white), cool, painful, or very swollen fingers or toes beyond the cast.  You have tingling or numbness around the injured area.  You have severe pain or pressure under the cast.  You have any difficulty with your breathing or have shortness  of breath.  You have chest pain.   This information is not intended to replace advice given to you by your health care provider. Make sure you discuss any questions you have with your health care provider.   Document Released: 03/14/2000 Document Revised: 01/05/2013 Document Reviewed: 09/23/2012 Elsevier Interactive Patient Education [employer]2016 [employer] Inc.

## 2015-09-26 NOTE — Progress Notes (Signed)
Mary Travis is a 82 y.o. female who [facility] for [facility].  Patient was in her normal state of health at home yesterday when she fell going up stairs landing on her right knee right wrist and right forehead. She notes significant pain and swelling at the right forehead right wrist and right knee. She is unable to walk unassisted and cannot use her walker because her right wrist hurts. She does have the ability to have 24-hour supervision at home. She has not taken any medications for pain but does note the pain is pretty significant. No fevers or chills nausea vomiting or diarrhea. No loss of consciousness. She is acting normally per her family members.   Past Medical History  Diagnosis Date  . Hypertension   . Seasonal allergies   . Depression   . Hyperlipidemia   . GERD (gastroesophageal reflux disease)   . Diabetes 1.5, managed as type 2 (HCC)   . CHF (congestive heart failure) (HCC)   . Thyroid disease   . Atrial fibrillation [facility](HCC)    Past Surgical History  Procedure Laterality Date  . Back surgery    . Knee arthrocentesis     Social History  Substance Use Topics  . Smoking status: Never Smoker   . Smokeless tobacco: Not on file  . Alcohol Use: No   family history is not on file.  ROS:  No headache, visual changes, nausea, vomiting, diarrhea, constipation, dizziness, abdominal pain, skin rash, fevers, chills, night sweats, weight loss, swollen lymph nodes, body aches, joint swelling, muscle aches, chest pain, shortness of breath, mood changes, visual or auditory hallucinations.    Medications: Current Outpatient Prescriptions  Medication Sig Dispense Refill  . AMBULATORY NON FORMULARY MEDICATION Knee-high, medium compression, graduated compression stockings. Apply to lower extremities. 1 each 0  . AMBULATORY NON FORMULARY MEDICATION Motorized scooter 1 each 0  . AMBULATORY NON FORMULARY MEDICATION  Transport Wheel Chair (size to be determined by weight and height) HT. 61 inches WT. 159 lbs Lumbar degenerative disc disease ICD - 10 M51.36  All other parts of the order unchanged. 1 each 0  . apixaban (ELIQUIS) 2.5 MG TABS tablet Take 2.5 mg by mouth.    Marland Kitchen. aspirin EC 81 MG tablet Take 81 mg by mouth. Reported on 08/20/2015    . calcium carbonate (CALCIUM 600) 600 MG TABS tablet Take by mouth.    . carvedilol (COREG) 12.5 MG tablet Take 12.5 mg by mouth 2 (two) times daily with a meal.    . gabapentin (NEURONTIN) 300 MG capsule Take 2 capsules (600 mg total) by mouth at bedtime. 180 capsule 3  . levothyroxine (SYNTHROID, LEVOTHROID) 75 MCG tablet Take 75 mcg by mouth daily.    Marland Kitchen. lidocaine (XYLOCAINE) 5 % ointment Apply 1 application topically 3 (three) times daily as needed. Affected area on right thigh 150 g 2  . lisinopril (PRINIVIL,ZESTRIL) 40 MG tablet Take 40 mg by mouth daily. Reported on 08/20/2015    . meloxicam (MOBIC) 15 MG tablet One tab PO qAM with breakfast for 2 weeks, then daily prn pain. 30 tablet 3  . Multiple Vitamins-Minerals (CENTRUM SILVER PO) Take by mouth.    . nortriptyline (PAMELOR) 25 MG capsule Take 25 mg by mouth 2 (two) times daily.    Marland Kitchen. omeprazole (PRILOSEC) 40 MG capsule Take 40 mg by mouth daily.    . Potassium 99 MG TABS Take by mouth. Reported on 08/20/2015    .  simvastatin (ZOCOR) 20 MG tablet Take 20 mg by mouth every evening.    . torsemide (DEMADEX) 20 MG tablet      No current facility-administered medications for this visit.   Allergies  Allergen Reactions  . Codeine Nausea Only  . Penicillins Rash     Exam:  BP 102/68 mmHg  Pulse 68 General: Well Developed, well nourished, and in no acute distress.  Neuro/Psych: Alert and oriented x3, extra-ocular muscles intact, able to move all 4 extremities, sensation grossly intact. Skin: Warm and dry, no rashes noted.  Respiratory: Not using accessory muscles, speaking in full sentences, trachea midline.    Cardiovascular: Pulses palpable, no extremity edema. Abdomen: Does not appear distended. MSK:  Head: Significant ecchymosis right forehead. Orbit is nontender Right wrist: Ecchymosis and swelling and tenderness especially at the proximal fifth metacarpal. Wrist motion impaired by pain. Capillary refill sensation and pulses intact distally. Right knee: Swollen and tender without significant ecchymosis. Pain with motion. Unable to stand unassisted.   Patient was fitted with a well made ulnar gutter splint    No results found for this or any previous visit (from the past 24 hour(s)). Dg Wrist Complete Right  09/26/2015  CLINICAL DATA:  Fall.  Pain.  Initial evaluation. EXAM: RIGHT WRIST - COMPLETE 3+ VIEW COMPARISON:  12/20/2009. FINDINGS: Diffuse degenerative change. Tiny bony density is noted the base of the right fifth metacarpal. This could represent a tiny fracture fragment. This may be old. No other focal abnormality identified. IMPRESSION: 1. Diffuse degenerative change. 2. Tiny bony density noted at the base of the right fifth metacarpal. This could represent a tiny fracture fragment. This may be old. No other abnormality identified . Electronically Signed   By: Maisie Fus  Register   On: 09/26/2015 11:57   Dg Tibia/fibula Right  09/26/2015  CLINICAL DATA:  Fall, landing on right side.  Right leg pain EXAM: RIGHT TIBIA AND FIBULA - 2 VIEW COMPARISON:  None. FINDINGS: Prior medial compartment right knee hemiarthroplasty. Chondrocalcinosis noted in the lateral compartment. Mild degenerative changes in the right knee. No joint effusion. No acute bony abnormality. Specifically, no fracture, subluxation, or dislocation. Soft tissues are intact. IMPRESSION: No acute bony abnormality. Prior right knee medial compartment hemiarthroplasty. Electronically Signed   By: Charlett Nose M.D.   On: 09/26/2015 11:57   Ct Head Wo Contrast  09/26/2015  CLINICAL DATA:  Pain following fall 1 day prior EXAM: CT HEAD  WITHOUT CONTRAST TECHNIQUE: Contiguous axial images were obtained from the base of the skull through the vertex without intravenous contrast. COMPARISON:  None. FINDINGS: Brain: There is mild diffuse atrophy. There is no intracranial mass, hemorrhage, extra-axial fluid collection, or midline shift. There is patchy small vessel disease in the centra semiovale bilaterally. There is a prior small lacunar type infarct in the anterior limb of the right internal capsule. There is apparent small vessel disease in the mid pons region in the basilar perforator distribution. No acute appearing infarct is evident. Vascular: There are foci of calcification in the distal left vertebral body as well as in both cavernous carotid artery regions. Skull: Bony calvarium appears intact. Sinuses/Orbits: Orbits appear symmetric bilaterally. There is extensive opacification of the left sphenoid sinus. There is mild mucosal thickening in the right sphenoid sinus posteriorly. Other visualized paranasal sinuses are clear. Other: Mastoid air cells bilaterally are clear. IMPRESSION: Atrophy with patchy supratentorial and infratentorial small vessel disease. Prior small lacunar infarct in the anterior limb of the right internal capsule. No  intracranial mass, hemorrhage, or extra-axial fluid collection. No acute appearing infarct evident. Areas of paranasal sinus disease, most marked in the left sphenoid sinus. Foci of arterial vascular calcification noted, consistent with atherosclerosis. Electronically Signed   By: Bretta BangWilliam  Woodruff III M.D.   On: 09/26/2015 11:32   Dg Femur, Min 2 Views Right  09/26/2015  CLINICAL DATA:  Fall last night, landing on right side. EXAM: RIGHT FEMUR 2 VIEWS COMPARISON:  None. FINDINGS: Changes of right knee medial compartment hemiarthroplasty. Mild degenerative changes in the right knee. No acute bony abnormality. Specifically, no fracture, subluxation, or dislocation. Soft tissues are intact. IMPRESSION: No  acute bony abnormality. Medial compartment right knee hemiarthroplasty. Electronically Signed   By: Charlett NoseKevin  Dover M.D.   On: 09/26/2015 11:57     Assessment and Plan: 82 y.o. female with  1) right proximal fifth metacarpal fracture: Plan to treat with ulnar gutter splint. Follow-up with Dr. Karie Schwalbe next week for cast change 2) forehead contusion: Head CT scan with no acute changes. Plan for watchful waiting and ice as needed. 3) knee contusion: Doing reasonably well. X-ray unremarkable. We'll obtain further imaging studies of knee pain continues to be problematic.  Pain control with Tylenol. I recommended 24-hour supervision. Hopefully existing [facility] will be able to add services.   I spent 40 minutes with this patient, greater than 50% was face-to-face time counseling regarding the above diagnosis.  CC: JUDGE,ERIN, FNP

## 2015-09-27 ENCOUNTER — Ambulatory Visit: Payer: Medicare Other | Admitting: Sports Medicine

## 2015-09-27 ENCOUNTER — Encounter: Payer: Self-pay | Admitting: Family Medicine

## 2015-09-27 ENCOUNTER — Ambulatory Visit (INDEPENDENT_AMBULATORY_CARE_PROVIDER_SITE_OTHER): Payer: Medicare Other | Admitting: Family Medicine

## 2015-09-27 VITALS — BP 138/83 | HR 80

## 2015-09-27 DIAGNOSIS — S62306A Unspecified fracture of fifth metacarpal bone, right hand, initial encounter for closed fracture: Secondary | ICD-10-CM | POA: Diagnosis not present

## 2015-09-27 NOTE — Patient Instructions (Signed)
Thank you for coming in today. Return as directed or sooner if needed.

## 2015-09-27 NOTE — Progress Notes (Signed)
Mary Travis is a 82 y.o. female who [facility] today for hand swelling. Patient was seen yesterday and diagnosed with a proximal fifth metacarpal fracture of her right hand. She was placed into an ulnar gutter splint. She returns today with hand pain and swelling. She denies any new injury.   Past Medical History  Diagnosis Date  . Hypertension   . Seasonal allergies   . Depression   . Hyperlipidemia   . GERD (gastroesophageal reflux disease)   . Diabetes 1.5, managed as type 2 (HCC)   . CHF (congestive heart failure) (HCC)   . Thyroid disease   . Atrial fibrillation [facility](HCC)    Past Surgical History  Procedure Laterality Date  . Back surgery    . Knee arthrocentesis     Social History  Substance Use Topics  . Smoking status: Never Smoker   . Smokeless tobacco: Not on file  . Alcohol Use: No   family history is not on file.  ROS as above:  Medications: Current Outpatient Prescriptions  Medication Sig Dispense Refill  . AMBULATORY NON FORMULARY MEDICATION Knee-high, medium compression, graduated compression stockings. Apply to lower extremities. 1 each 0  . AMBULATORY NON FORMULARY MEDICATION Motorized scooter 1 each 0  . AMBULATORY NON FORMULARY MEDICATION Transport Wheel Chair (size to be determined by weight and height) HT. 61 inches WT. 159 lbs Lumbar degenerative disc disease ICD - 10 M51.36  All other parts of the order unchanged. 1 each 0  . apixaban (ELIQUIS) 2.5 MG TABS tablet Take 2.5 mg by mouth.    Marland Kitchen. aspirin EC 81 MG tablet Take 81 mg by mouth. Reported on 08/20/2015    . calcium carbonate (CALCIUM 600) 600 MG TABS tablet Take by mouth.    . carvedilol (COREG) 12.5 MG tablet Take 12.5 mg by mouth 2 (two) times daily with a meal.    . gabapentin (NEURONTIN) 300 MG capsule Take 2 capsules (600 mg total) by mouth at bedtime. 180  capsule 3  . levothyroxine (SYNTHROID, LEVOTHROID) 75 MCG tablet Take 75 mcg by mouth daily.    Marland Kitchen. lidocaine (XYLOCAINE) 5 % ointment Apply 1 application topically 3 (three) times daily as needed. Affected area on right thigh 150 g 2  . lisinopril (PRINIVIL,ZESTRIL) 40 MG tablet Take 40 mg by mouth daily. Reported on 08/20/2015    . meloxicam (MOBIC) 15 MG tablet One tab PO qAM with breakfast for 2 weeks, then daily prn pain. 30 tablet 3  . Multiple Vitamins-Minerals (CENTRUM SILVER PO) Take by mouth.    . nortriptyline (PAMELOR) 25 MG capsule Take 25 mg by mouth 2 (two) times daily.    Marland Kitchen. omeprazole (PRILOSEC) 40 MG capsule Take 40 mg by mouth daily.    . Potassium 99 MG TABS Take by mouth. Reported on 08/20/2015    . simvastatin (ZOCOR) 20 MG tablet Take 20 mg by mouth every evening.    . torsemide (DEMADEX) 20 MG tablet      No current facility-administered medications for this visit.   Allergies  Allergen Reactions  . Codeine Nausea Only  . Penicillins Rash     Exam:  BP 138/83 mmHg  Pulse 80 Gen: Well NAD Right hand significant edema in the areas where there is no Ace wrap. The splint was removed. The fiberglass portion splint appears to be well made not impinging or compressing any areas. The splint was rewrapped more  loosely this time. Patient felt much better.   No results found for this or any previous visit (from the past 24 hour(s)). Dg Wrist Complete Right  09/26/2015  CLINICAL DATA:  Fall.  Pain.  Initial evaluation. EXAM: RIGHT WRIST - COMPLETE 3+ VIEW COMPARISON:  12/20/2009. FINDINGS: Diffuse degenerative change. Tiny bony density is noted the base of the right fifth metacarpal. This could represent a tiny fracture fragment. This may be old. No other focal abnormality identified. IMPRESSION: 1. Diffuse degenerative change. 2. Tiny bony density noted at the base of the right fifth metacarpal. This could represent a tiny fracture fragment. This may be old. No other  abnormality identified . Electronically Signed   By: Maisie Fushomas  Register   On: 09/26/2015 11:57   Dg Tibia/fibula Right  09/26/2015  CLINICAL DATA:  Fall, landing on right side.  Right leg pain EXAM: RIGHT TIBIA AND FIBULA - 2 VIEW COMPARISON:  None. FINDINGS: Prior medial compartment right knee hemiarthroplasty. Chondrocalcinosis noted in the lateral compartment. Mild degenerative changes in the right knee. No joint effusion. No acute bony abnormality. Specifically, no fracture, subluxation, or dislocation. Soft tissues are intact. IMPRESSION: No acute bony abnormality. Prior right knee medial compartment hemiarthroplasty. Electronically Signed   By: Charlett NoseKevin  Dover M.D.   On: 09/26/2015 11:57   Ct Head Wo Contrast  09/26/2015  CLINICAL DATA:  Pain following fall 1 day prior EXAM: CT HEAD WITHOUT CONTRAST TECHNIQUE: Contiguous axial images were obtained from the base of the skull through the vertex without intravenous contrast. COMPARISON:  None. FINDINGS: Brain: There is mild diffuse atrophy. There is no intracranial mass, hemorrhage, extra-axial fluid collection, or midline shift. There is patchy small vessel disease in the centra semiovale bilaterally. There is a prior small lacunar type infarct in the anterior limb of the right internal capsule. There is apparent small vessel disease in the mid pons region in the basilar perforator distribution. No acute appearing infarct is evident. Vascular: There are foci of calcification in the distal left vertebral body as well as in both cavernous carotid artery regions. Skull: Bony calvarium appears intact. Sinuses/Orbits: Orbits appear symmetric bilaterally. There is extensive opacification of the left sphenoid sinus. There is mild mucosal thickening in the right sphenoid sinus posteriorly. Other visualized paranasal sinuses are clear. Other: Mastoid air cells bilaterally are clear. IMPRESSION: Atrophy with patchy supratentorial and infratentorial small vessel disease.  Prior small lacunar infarct in the anterior limb of the right internal capsule. No intracranial mass, hemorrhage, or extra-axial fluid collection. No acute appearing infarct evident. Areas of paranasal sinus disease, most marked in the left sphenoid sinus. Foci of arterial vascular calcification noted, consistent with atherosclerosis. Electronically Signed   By: Bretta BangWilliam  Woodruff III M.D.   On: 09/26/2015 11:32   Dg Femur, Min 2 Views Right  09/26/2015  CLINICAL DATA:  Fall last night, landing on right side. EXAM: RIGHT FEMUR 2 VIEWS COMPARISON:  None. FINDINGS: Changes of right knee medial compartment hemiarthroplasty. Mild degenerative changes in the right knee. No acute bony abnormality. Specifically, no fracture, subluxation, or dislocation. Soft tissues are intact. IMPRESSION: No acute bony abnormality. Medial compartment right knee hemiarthroplasty. Electronically Signed   By: Charlett NoseKevin  Dover M.D.   On: 09/26/2015 11:57      Assessment and Plan: 82 y.o. female with hand swelling.  Splint reapplied a little bit more loosely. Patient felt better. Return as directed with Dr. Karie Schwalbe  Discussed warning signs or symptoms. Please see discharge instructions. Patient expresses understanding.

## 2015-09-28 ENCOUNTER — Telehealth: Payer: Self-pay

## 2015-09-28 DIAGNOSIS — S81802A Unspecified open wound, left lower leg, initial encounter: Secondary | ICD-10-CM | POA: Diagnosis not present

## 2015-09-28 DIAGNOSIS — E11620 Type 2 diabetes mellitus with diabetic dermatitis: Secondary | ICD-10-CM | POA: Diagnosis not present

## 2015-09-28 DIAGNOSIS — M1611 Unilateral primary osteoarthritis, right hip: Secondary | ICD-10-CM | POA: Diagnosis not present

## 2015-09-28 DIAGNOSIS — I272 Other secondary pulmonary hypertension: Secondary | ICD-10-CM | POA: Diagnosis not present

## 2015-09-28 DIAGNOSIS — M5136 Other intervertebral disc degeneration, lumbar region: Secondary | ICD-10-CM | POA: Diagnosis not present

## 2015-09-28 DIAGNOSIS — I429 Cardiomyopathy, unspecified: Secondary | ICD-10-CM | POA: Diagnosis not present

## 2015-09-28 DIAGNOSIS — I110 Hypertensive heart disease with heart failure: Secondary | ICD-10-CM | POA: Diagnosis not present

## 2015-09-28 DIAGNOSIS — I480 Paroxysmal atrial fibrillation: Secondary | ICD-10-CM | POA: Diagnosis not present

## 2015-09-28 DIAGNOSIS — E039 Hypothyroidism, unspecified: Secondary | ICD-10-CM | POA: Diagnosis not present

## 2015-09-28 DIAGNOSIS — I5022 Chronic systolic (congestive) heart failure: Secondary | ICD-10-CM | POA: Diagnosis not present

## 2015-09-28 DIAGNOSIS — Z9181 History of falling: Secondary | ICD-10-CM | POA: Diagnosis not present

## 2015-09-28 DIAGNOSIS — M7061 Trochanteric bursitis, right hip: Secondary | ICD-10-CM | POA: Diagnosis not present

## 2015-09-28 NOTE — Telephone Encounter (Signed)
[facility] with [facility] called and stated Mrs. Callens had a fall. She now has a broken wrist and a black eye. Di KindleLaurel was going to discharge patient but will not stay a few more weeks.

## 2015-09-28 NOTE — Telephone Encounter (Signed)
"  will NOW stay?"

## 2015-09-28 NOTE — Telephone Encounter (Signed)
Sorry, yes NOW stay.

## 2015-10-01 DIAGNOSIS — I480 Paroxysmal atrial fibrillation: Secondary | ICD-10-CM | POA: Diagnosis not present

## 2015-10-01 DIAGNOSIS — I110 Hypertensive heart disease with heart failure: Secondary | ICD-10-CM | POA: Diagnosis not present

## 2015-10-01 DIAGNOSIS — E11620 Type 2 diabetes mellitus with diabetic dermatitis: Secondary | ICD-10-CM | POA: Diagnosis not present

## 2015-10-01 DIAGNOSIS — M7061 Trochanteric bursitis, right hip: Secondary | ICD-10-CM | POA: Diagnosis not present

## 2015-10-01 DIAGNOSIS — I5022 Chronic systolic (congestive) heart failure: Secondary | ICD-10-CM | POA: Diagnosis not present

## 2015-10-01 DIAGNOSIS — S81802A Unspecified open wound, left lower leg, initial encounter: Secondary | ICD-10-CM | POA: Diagnosis not present

## 2015-10-01 DIAGNOSIS — E039 Hypothyroidism, unspecified: Secondary | ICD-10-CM | POA: Diagnosis not present

## 2015-10-01 DIAGNOSIS — Z9181 History of falling: Secondary | ICD-10-CM | POA: Diagnosis not present

## 2015-10-01 DIAGNOSIS — I272 Other secondary pulmonary hypertension: Secondary | ICD-10-CM | POA: Diagnosis not present

## 2015-10-01 DIAGNOSIS — I429 Cardiomyopathy, unspecified: Secondary | ICD-10-CM | POA: Diagnosis not present

## 2015-10-01 DIAGNOSIS — M5136 Other intervertebral disc degeneration, lumbar region: Secondary | ICD-10-CM | POA: Diagnosis not present

## 2015-10-01 DIAGNOSIS — M1611 Unilateral primary osteoarthritis, right hip: Secondary | ICD-10-CM | POA: Diagnosis not present

## 2015-10-04 ENCOUNTER — Ambulatory Visit (INDEPENDENT_AMBULATORY_CARE_PROVIDER_SITE_OTHER): Payer: Medicare Other | Admitting: Sports Medicine

## 2015-10-04 VITALS — BP 128/76 | HR 84 | Resp 18

## 2015-10-04 DIAGNOSIS — I429 Cardiomyopathy, unspecified: Secondary | ICD-10-CM | POA: Diagnosis not present

## 2015-10-04 DIAGNOSIS — I272 Other secondary pulmonary hypertension: Secondary | ICD-10-CM | POA: Diagnosis not present

## 2015-10-04 DIAGNOSIS — M1611 Unilateral primary osteoarthritis, right hip: Secondary | ICD-10-CM | POA: Diagnosis not present

## 2015-10-04 DIAGNOSIS — M7061 Trochanteric bursitis, right hip: Secondary | ICD-10-CM | POA: Diagnosis not present

## 2015-10-04 DIAGNOSIS — S81802A Unspecified open wound, left lower leg, initial encounter: Secondary | ICD-10-CM | POA: Diagnosis not present

## 2015-10-04 DIAGNOSIS — I110 Hypertensive heart disease with heart failure: Secondary | ICD-10-CM | POA: Diagnosis not present

## 2015-10-04 DIAGNOSIS — S6991XD Unspecified injury of right wrist, hand and finger(s), subsequent encounter: Secondary | ICD-10-CM | POA: Diagnosis not present

## 2015-10-04 DIAGNOSIS — I5022 Chronic systolic (congestive) heart failure: Secondary | ICD-10-CM | POA: Diagnosis not present

## 2015-10-04 DIAGNOSIS — E039 Hypothyroidism, unspecified: Secondary | ICD-10-CM | POA: Diagnosis not present

## 2015-10-04 DIAGNOSIS — I480 Paroxysmal atrial fibrillation: Secondary | ICD-10-CM | POA: Diagnosis not present

## 2015-10-04 DIAGNOSIS — E11620 Type 2 diabetes mellitus with diabetic dermatitis: Secondary | ICD-10-CM | POA: Diagnosis not present

## 2015-10-04 DIAGNOSIS — M5136 Other intervertebral disc degeneration, lumbar region: Secondary | ICD-10-CM | POA: Diagnosis not present

## 2015-10-04 DIAGNOSIS — Z9181 History of falling: Secondary | ICD-10-CM | POA: Diagnosis not present

## 2015-10-04 NOTE — Progress Notes (Signed)
  Subjective:    CC: Follow-up  HPI: Right hand injury: Initially suspected fractured the base of the fifth metacarpal, she returns today pain-free, after a couple of weeks in an ulnar gutter splint, there is no swelling, no bruising.  Facial bruising: CT head was negative for fracture or intracranial collection.  Past medical history, Surgical history, Family history not pertinant except as noted below, Social history, Allergies, and medications have been entered into the medical record, reviewed, and no changes needed.   Review of Systems: No fevers, chills, night sweats, weight loss, chest pain, or shortness of breath.   Objective:    General: Well Developed, well nourished, and in no acute distress.  Neuro: Alert and oriented x3, extra-ocular muscles intact, sensation grossly intact.  HEENT: Normocephalic, Bruising over the right eyelid with tenderness over the right orbit, pupils equal round reactive to light, neck supple, no masses, no lymphadenopathy, thyroid nonpalpable.  Skin: Warm and dry, no rashes. Cardiac: Regular rate and rhythm, no murmurs rubs or gallops, no lower extremity edema.  Respiratory: Clear to auscultation bilaterally. Not using accessory muscles, speaking in full sentences. Right Wrist: Inspection normal with no visible erythema or swelling. ROM smooth and normal with good flexion and extension and ulnar/radial deviation that is symmetrical with opposite wrist. Palpation is normal over metacarpals, navicular, lunate, and TFCC; tendons without tenderness/ swelling No snuffbox tenderness. No tenderness over Canal of Guyon. Strength 5/5 in all directions without pain. Negative Finkelstein, tinel's and phalens. Negative Watson's test. No tenderness to palpation specifically at the base of the fifth metacarpal  Impression and Recommendations:

## 2015-10-04 NOTE — Assessment & Plan Note (Signed)
I think what was seen on the x-ray is from an old injury, after only 2 weeks in an ulnar gutter splint she is completely pain-free, no swelling, no bruising with full function of the hand. This was probably not a fracture, she is able to hold my hand and I can pull her in her wheelchair. Return as needed.

## 2015-10-08 DIAGNOSIS — M5136 Other intervertebral disc degeneration, lumbar region: Secondary | ICD-10-CM | POA: Diagnosis not present

## 2015-10-08 DIAGNOSIS — Z9181 History of falling: Secondary | ICD-10-CM | POA: Diagnosis not present

## 2015-10-08 DIAGNOSIS — E11620 Type 2 diabetes mellitus with diabetic dermatitis: Secondary | ICD-10-CM | POA: Diagnosis not present

## 2015-10-08 DIAGNOSIS — I5022 Chronic systolic (congestive) heart failure: Secondary | ICD-10-CM | POA: Diagnosis not present

## 2015-10-08 DIAGNOSIS — I480 Paroxysmal atrial fibrillation: Secondary | ICD-10-CM | POA: Diagnosis not present

## 2015-10-08 DIAGNOSIS — I272 Other secondary pulmonary hypertension: Secondary | ICD-10-CM | POA: Diagnosis not present

## 2015-10-08 DIAGNOSIS — M7061 Trochanteric bursitis, right hip: Secondary | ICD-10-CM | POA: Diagnosis not present

## 2015-10-08 DIAGNOSIS — I429 Cardiomyopathy, unspecified: Secondary | ICD-10-CM | POA: Diagnosis not present

## 2015-10-08 DIAGNOSIS — I110 Hypertensive heart disease with heart failure: Secondary | ICD-10-CM | POA: Diagnosis not present

## 2015-10-08 DIAGNOSIS — S81802A Unspecified open wound, left lower leg, initial encounter: Secondary | ICD-10-CM | POA: Diagnosis not present

## 2015-10-08 DIAGNOSIS — M1611 Unilateral primary osteoarthritis, right hip: Secondary | ICD-10-CM | POA: Diagnosis not present

## 2015-10-08 DIAGNOSIS — E039 Hypothyroidism, unspecified: Secondary | ICD-10-CM | POA: Diagnosis not present

## 2015-10-10 DIAGNOSIS — I429 Cardiomyopathy, unspecified: Secondary | ICD-10-CM | POA: Diagnosis not present

## 2015-10-10 DIAGNOSIS — S81802A Unspecified open wound, left lower leg, initial encounter: Secondary | ICD-10-CM | POA: Diagnosis not present

## 2015-10-10 DIAGNOSIS — E039 Hypothyroidism, unspecified: Secondary | ICD-10-CM | POA: Diagnosis not present

## 2015-10-10 DIAGNOSIS — I272 Other secondary pulmonary hypertension: Secondary | ICD-10-CM | POA: Diagnosis not present

## 2015-10-10 DIAGNOSIS — M1611 Unilateral primary osteoarthritis, right hip: Secondary | ICD-10-CM | POA: Diagnosis not present

## 2015-10-10 DIAGNOSIS — E11620 Type 2 diabetes mellitus with diabetic dermatitis: Secondary | ICD-10-CM | POA: Diagnosis not present

## 2015-10-10 DIAGNOSIS — M7061 Trochanteric bursitis, right hip: Secondary | ICD-10-CM | POA: Diagnosis not present

## 2015-10-10 DIAGNOSIS — Z9181 History of falling: Secondary | ICD-10-CM | POA: Diagnosis not present

## 2015-10-10 DIAGNOSIS — M5136 Other intervertebral disc degeneration, lumbar region: Secondary | ICD-10-CM | POA: Diagnosis not present

## 2015-10-10 DIAGNOSIS — I110 Hypertensive heart disease with heart failure: Secondary | ICD-10-CM | POA: Diagnosis not present

## 2015-10-10 DIAGNOSIS — I480 Paroxysmal atrial fibrillation: Secondary | ICD-10-CM | POA: Diagnosis not present

## 2015-10-10 DIAGNOSIS — I5022 Chronic systolic (congestive) heart failure: Secondary | ICD-10-CM | POA: Diagnosis not present

## 2015-10-15 DIAGNOSIS — E11620 Type 2 diabetes mellitus with diabetic dermatitis: Secondary | ICD-10-CM | POA: Diagnosis not present

## 2015-10-15 DIAGNOSIS — I429 Cardiomyopathy, unspecified: Secondary | ICD-10-CM | POA: Diagnosis not present

## 2015-10-15 DIAGNOSIS — E039 Hypothyroidism, unspecified: Secondary | ICD-10-CM | POA: Diagnosis not present

## 2015-10-15 DIAGNOSIS — S81802A Unspecified open wound, left lower leg, initial encounter: Secondary | ICD-10-CM | POA: Diagnosis not present

## 2015-10-15 DIAGNOSIS — I272 Other secondary pulmonary hypertension: Secondary | ICD-10-CM | POA: Diagnosis not present

## 2015-10-15 DIAGNOSIS — Z9181 History of falling: Secondary | ICD-10-CM | POA: Diagnosis not present

## 2015-10-15 DIAGNOSIS — M5136 Other intervertebral disc degeneration, lumbar region: Secondary | ICD-10-CM | POA: Diagnosis not present

## 2015-10-15 DIAGNOSIS — M1611 Unilateral primary osteoarthritis, right hip: Secondary | ICD-10-CM | POA: Diagnosis not present

## 2015-10-15 DIAGNOSIS — I110 Hypertensive heart disease with heart failure: Secondary | ICD-10-CM | POA: Diagnosis not present

## 2015-10-15 DIAGNOSIS — M7061 Trochanteric bursitis, right hip: Secondary | ICD-10-CM | POA: Diagnosis not present

## 2015-10-15 DIAGNOSIS — I5022 Chronic systolic (congestive) heart failure: Secondary | ICD-10-CM | POA: Diagnosis not present

## 2015-10-15 DIAGNOSIS — I480 Paroxysmal atrial fibrillation: Secondary | ICD-10-CM | POA: Diagnosis not present

## 2015-10-17 DIAGNOSIS — Z9181 History of falling: Secondary | ICD-10-CM | POA: Diagnosis not present

## 2015-10-17 DIAGNOSIS — M5136 Other intervertebral disc degeneration, lumbar region: Secondary | ICD-10-CM | POA: Diagnosis not present

## 2015-10-17 DIAGNOSIS — I110 Hypertensive heart disease with heart failure: Secondary | ICD-10-CM | POA: Diagnosis not present

## 2015-10-17 DIAGNOSIS — I272 Other secondary pulmonary hypertension: Secondary | ICD-10-CM | POA: Diagnosis not present

## 2015-10-17 DIAGNOSIS — I480 Paroxysmal atrial fibrillation: Secondary | ICD-10-CM | POA: Diagnosis not present

## 2015-10-17 DIAGNOSIS — E039 Hypothyroidism, unspecified: Secondary | ICD-10-CM | POA: Diagnosis not present

## 2015-10-17 DIAGNOSIS — M1611 Unilateral primary osteoarthritis, right hip: Secondary | ICD-10-CM | POA: Diagnosis not present

## 2015-10-17 DIAGNOSIS — S81802A Unspecified open wound, left lower leg, initial encounter: Secondary | ICD-10-CM | POA: Diagnosis not present

## 2015-10-17 DIAGNOSIS — I5022 Chronic systolic (congestive) heart failure: Secondary | ICD-10-CM | POA: Diagnosis not present

## 2015-10-17 DIAGNOSIS — I429 Cardiomyopathy, unspecified: Secondary | ICD-10-CM | POA: Diagnosis not present

## 2015-10-17 DIAGNOSIS — M7061 Trochanteric bursitis, right hip: Secondary | ICD-10-CM | POA: Diagnosis not present

## 2015-10-17 DIAGNOSIS — E11620 Type 2 diabetes mellitus with diabetic dermatitis: Secondary | ICD-10-CM | POA: Diagnosis not present

## 2015-11-20 DIAGNOSIS — E1143 Type 2 diabetes mellitus with diabetic autonomic (poly)neuropathy: Secondary | ICD-10-CM | POA: Diagnosis not present

## 2015-11-20 DIAGNOSIS — M899 Disorder of bone, unspecified: Secondary | ICD-10-CM | POA: Diagnosis not present

## 2015-11-20 DIAGNOSIS — I10 Essential (primary) hypertension: Secondary | ICD-10-CM | POA: Diagnosis not present

## 2015-11-20 DIAGNOSIS — M810 Age-related osteoporosis without current pathological fracture: Secondary | ICD-10-CM | POA: Diagnosis not present

## 2015-11-20 DIAGNOSIS — E039 Hypothyroidism, unspecified: Secondary | ICD-10-CM | POA: Diagnosis not present

## 2015-11-20 DIAGNOSIS — Z0000 Encounter for general adult medical examination without abnormal findings: Secondary | ICD-10-CM | POA: Diagnosis not present

## 2015-11-20 DIAGNOSIS — E7800 Pure hypercholesterolemia, unspecified: Secondary | ICD-10-CM | POA: Diagnosis not present

## 2015-11-23 ENCOUNTER — Other Ambulatory Visit: Payer: Self-pay | Admitting: Sports Medicine

## 2015-11-23 DIAGNOSIS — M5136 Other intervertebral disc degeneration, lumbar region: Secondary | ICD-10-CM

## 2015-11-27 DIAGNOSIS — Z1211 Encounter for screening for malignant neoplasm of colon: Secondary | ICD-10-CM | POA: Diagnosis not present

## 2015-11-27 DIAGNOSIS — Z79899 Other long term (current) drug therapy: Secondary | ICD-10-CM | POA: Diagnosis not present

## 2015-12-24 DIAGNOSIS — I482 Chronic atrial fibrillation: Secondary | ICD-10-CM | POA: Diagnosis not present

## 2015-12-24 DIAGNOSIS — I10 Essential (primary) hypertension: Secondary | ICD-10-CM | POA: Diagnosis not present

## 2015-12-24 DIAGNOSIS — I5032 Chronic diastolic (congestive) heart failure: Secondary | ICD-10-CM | POA: Diagnosis not present

## 2015-12-24 DIAGNOSIS — Z8673 Personal history of transient ischemic attack (TIA), and cerebral infarction without residual deficits: Secondary | ICD-10-CM | POA: Diagnosis not present

## 2015-12-27 DIAGNOSIS — Z23 Encounter for immunization: Secondary | ICD-10-CM | POA: Diagnosis not present

## 2015-12-28 DIAGNOSIS — H9190 Unspecified hearing loss, unspecified ear: Secondary | ICD-10-CM | POA: Diagnosis not present

## 2015-12-28 DIAGNOSIS — Z7901 Long term (current) use of anticoagulants: Secondary | ICD-10-CM | POA: Diagnosis not present

## 2015-12-28 DIAGNOSIS — K219 Gastro-esophageal reflux disease without esophagitis: Secondary | ICD-10-CM | POA: Diagnosis not present

## 2015-12-28 DIAGNOSIS — Z79899 Other long term (current) drug therapy: Secondary | ICD-10-CM | POA: Diagnosis not present

## 2015-12-28 DIAGNOSIS — Z791 Long term (current) use of non-steroidal anti-inflammatories (NSAID): Secondary | ICD-10-CM | POA: Diagnosis not present

## 2015-12-28 DIAGNOSIS — E119 Type 2 diabetes mellitus without complications: Secondary | ICD-10-CM | POA: Diagnosis not present

## 2015-12-28 DIAGNOSIS — Z880 Allergy status to penicillin: Secondary | ICD-10-CM | POA: Diagnosis not present

## 2015-12-28 DIAGNOSIS — S40021A Contusion of right upper arm, initial encounter: Secondary | ICD-10-CM | POA: Diagnosis not present

## 2015-12-28 DIAGNOSIS — M810 Age-related osteoporosis without current pathological fracture: Secondary | ICD-10-CM | POA: Diagnosis not present

## 2015-12-28 DIAGNOSIS — Z7984 Long term (current) use of oral hypoglycemic drugs: Secondary | ICD-10-CM | POA: Diagnosis not present

## 2015-12-28 DIAGNOSIS — Z885 Allergy status to narcotic agent status: Secondary | ICD-10-CM | POA: Diagnosis not present

## 2015-12-28 DIAGNOSIS — E039 Hypothyroidism, unspecified: Secondary | ICD-10-CM | POA: Diagnosis not present

## 2015-12-28 DIAGNOSIS — S40011A Contusion of right shoulder, initial encounter: Secondary | ICD-10-CM | POA: Diagnosis not present

## 2015-12-28 DIAGNOSIS — S0990XA Unspecified injury of head, initial encounter: Secondary | ICD-10-CM | POA: Diagnosis not present

## 2015-12-28 DIAGNOSIS — S5011XA Contusion of right forearm, initial encounter: Secondary | ICD-10-CM | POA: Diagnosis not present

## 2015-12-28 DIAGNOSIS — I10 Essential (primary) hypertension: Secondary | ICD-10-CM | POA: Diagnosis not present

## 2015-12-28 DIAGNOSIS — Z96651 Presence of right artificial knee joint: Secondary | ICD-10-CM | POA: Diagnosis not present

## 2015-12-28 DIAGNOSIS — Z882 Allergy status to sulfonamides status: Secondary | ICD-10-CM | POA: Diagnosis not present

## 2015-12-28 DIAGNOSIS — Z87440 Personal history of urinary (tract) infections: Secondary | ICD-10-CM | POA: Diagnosis not present

## 2015-12-28 DIAGNOSIS — E876 Hypokalemia: Secondary | ICD-10-CM | POA: Diagnosis not present

## 2015-12-28 DIAGNOSIS — W1789XA Other fall from one level to another, initial encounter: Secondary | ICD-10-CM | POA: Diagnosis not present

## 2016-01-04 DIAGNOSIS — I429 Cardiomyopathy, unspecified: Secondary | ICD-10-CM | POA: Diagnosis not present

## 2016-01-04 DIAGNOSIS — Z1231 Encounter for screening mammogram for malignant neoplasm of breast: Secondary | ICD-10-CM | POA: Diagnosis not present

## 2016-01-04 DIAGNOSIS — E876 Hypokalemia: Secondary | ICD-10-CM | POA: Diagnosis not present

## 2016-03-25 DIAGNOSIS — E039 Hypothyroidism, unspecified: Secondary | ICD-10-CM | POA: Diagnosis not present

## 2016-03-25 DIAGNOSIS — J069 Acute upper respiratory infection, unspecified: Secondary | ICD-10-CM | POA: Diagnosis not present

## 2016-03-25 DIAGNOSIS — R05 Cough: Secondary | ICD-10-CM | POA: Diagnosis not present

## 2016-03-25 DIAGNOSIS — E1143 Type 2 diabetes mellitus with diabetic autonomic (poly)neuropathy: Secondary | ICD-10-CM | POA: Diagnosis not present

## 2016-03-25 DIAGNOSIS — I10 Essential (primary) hypertension: Secondary | ICD-10-CM | POA: Diagnosis not present

## 2016-03-25 DIAGNOSIS — E7800 Pure hypercholesterolemia, unspecified: Secondary | ICD-10-CM | POA: Diagnosis not present

## 2016-03-30 DIAGNOSIS — I110 Hypertensive heart disease with heart failure: Secondary | ICD-10-CM | POA: Diagnosis not present

## 2016-03-30 DIAGNOSIS — Z79899 Other long term (current) drug therapy: Secondary | ICD-10-CM | POA: Diagnosis not present

## 2016-03-30 DIAGNOSIS — E118 Type 2 diabetes mellitus with unspecified complications: Secondary | ICD-10-CM | POA: Diagnosis not present

## 2016-03-30 DIAGNOSIS — Z880 Allergy status to penicillin: Secondary | ICD-10-CM | POA: Diagnosis not present

## 2016-03-30 DIAGNOSIS — R0600 Dyspnea, unspecified: Secondary | ICD-10-CM | POA: Diagnosis not present

## 2016-03-30 DIAGNOSIS — E039 Hypothyroidism, unspecified: Secondary | ICD-10-CM | POA: Diagnosis not present

## 2016-03-30 DIAGNOSIS — R0602 Shortness of breath: Secondary | ICD-10-CM | POA: Diagnosis not present

## 2016-03-30 DIAGNOSIS — J9601 Acute respiratory failure with hypoxia: Secondary | ICD-10-CM | POA: Diagnosis not present

## 2016-03-30 DIAGNOSIS — Z885 Allergy status to narcotic agent status: Secondary | ICD-10-CM | POA: Diagnosis not present

## 2016-03-30 DIAGNOSIS — Z7901 Long term (current) use of anticoagulants: Secondary | ICD-10-CM | POA: Diagnosis not present

## 2016-03-30 DIAGNOSIS — I482 Chronic atrial fibrillation: Secondary | ICD-10-CM | POA: Diagnosis not present

## 2016-03-30 DIAGNOSIS — B349 Viral infection, unspecified: Secondary | ICD-10-CM | POA: Diagnosis not present

## 2016-03-30 DIAGNOSIS — Z882 Allergy status to sulfonamides status: Secondary | ICD-10-CM | POA: Diagnosis not present

## 2016-03-30 DIAGNOSIS — I5030 Unspecified diastolic (congestive) heart failure: Secondary | ICD-10-CM | POA: Diagnosis not present

## 2016-03-30 DIAGNOSIS — I5033 Acute on chronic diastolic (congestive) heart failure: Secondary | ICD-10-CM | POA: Diagnosis not present

## 2016-03-30 DIAGNOSIS — I517 Cardiomegaly: Secondary | ICD-10-CM | POA: Diagnosis not present

## 2016-03-30 DIAGNOSIS — I519 Heart disease, unspecified: Secondary | ICD-10-CM | POA: Diagnosis not present

## 2016-03-30 DIAGNOSIS — M810 Age-related osteoporosis without current pathological fracture: Secondary | ICD-10-CM | POA: Diagnosis not present

## 2016-03-30 DIAGNOSIS — I10 Essential (primary) hypertension: Secondary | ICD-10-CM | POA: Diagnosis not present

## 2016-03-30 DIAGNOSIS — K219 Gastro-esophageal reflux disease without esophagitis: Secondary | ICD-10-CM | POA: Diagnosis not present

## 2016-03-30 DIAGNOSIS — I272 Pulmonary hypertension, unspecified: Secondary | ICD-10-CM | POA: Diagnosis not present

## 2016-03-30 DIAGNOSIS — I4891 Unspecified atrial fibrillation: Secondary | ICD-10-CM | POA: Diagnosis not present

## 2016-03-30 DIAGNOSIS — I429 Cardiomyopathy, unspecified: Secondary | ICD-10-CM | POA: Diagnosis not present

## 2016-03-30 DIAGNOSIS — I083 Combined rheumatic disorders of mitral, aortic and tricuspid valves: Secondary | ICD-10-CM | POA: Diagnosis not present

## 2016-03-30 DIAGNOSIS — E119 Type 2 diabetes mellitus without complications: Secondary | ICD-10-CM | POA: Diagnosis not present

## 2016-04-03 DIAGNOSIS — I5033 Acute on chronic diastolic (congestive) heart failure: Secondary | ICD-10-CM | POA: Diagnosis not present

## 2016-04-03 DIAGNOSIS — E119 Type 2 diabetes mellitus without complications: Secondary | ICD-10-CM | POA: Diagnosis not present

## 2016-04-03 DIAGNOSIS — I110 Hypertensive heart disease with heart failure: Secondary | ICD-10-CM | POA: Diagnosis not present

## 2016-04-03 DIAGNOSIS — B349 Viral infection, unspecified: Secondary | ICD-10-CM | POA: Diagnosis not present

## 2016-04-03 DIAGNOSIS — Z96651 Presence of right artificial knee joint: Secondary | ICD-10-CM | POA: Diagnosis not present

## 2016-04-03 DIAGNOSIS — J9601 Acute respiratory failure with hypoxia: Secondary | ICD-10-CM | POA: Diagnosis not present

## 2016-04-03 DIAGNOSIS — I482 Chronic atrial fibrillation: Secondary | ICD-10-CM | POA: Diagnosis not present

## 2016-04-03 DIAGNOSIS — I272 Pulmonary hypertension, unspecified: Secondary | ICD-10-CM | POA: Diagnosis not present

## 2016-04-03 DIAGNOSIS — Z7901 Long term (current) use of anticoagulants: Secondary | ICD-10-CM | POA: Diagnosis not present

## 2016-04-03 DIAGNOSIS — K219 Gastro-esophageal reflux disease without esophagitis: Secondary | ICD-10-CM | POA: Diagnosis not present

## 2016-04-03 DIAGNOSIS — Z9981 Dependence on supplemental oxygen: Secondary | ICD-10-CM | POA: Diagnosis not present

## 2016-04-04 DIAGNOSIS — Z7901 Long term (current) use of anticoagulants: Secondary | ICD-10-CM | POA: Diagnosis not present

## 2016-04-04 DIAGNOSIS — B349 Viral infection, unspecified: Secondary | ICD-10-CM | POA: Diagnosis not present

## 2016-04-04 DIAGNOSIS — Z96651 Presence of right artificial knee joint: Secondary | ICD-10-CM | POA: Diagnosis not present

## 2016-04-04 DIAGNOSIS — I482 Chronic atrial fibrillation: Secondary | ICD-10-CM | POA: Diagnosis not present

## 2016-04-04 DIAGNOSIS — I272 Pulmonary hypertension, unspecified: Secondary | ICD-10-CM | POA: Diagnosis not present

## 2016-04-04 DIAGNOSIS — J9601 Acute respiratory failure with hypoxia: Secondary | ICD-10-CM | POA: Diagnosis not present

## 2016-04-04 DIAGNOSIS — K219 Gastro-esophageal reflux disease without esophagitis: Secondary | ICD-10-CM | POA: Diagnosis not present

## 2016-04-04 DIAGNOSIS — I5033 Acute on chronic diastolic (congestive) heart failure: Secondary | ICD-10-CM | POA: Diagnosis not present

## 2016-04-04 DIAGNOSIS — I110 Hypertensive heart disease with heart failure: Secondary | ICD-10-CM | POA: Diagnosis not present

## 2016-04-04 DIAGNOSIS — E119 Type 2 diabetes mellitus without complications: Secondary | ICD-10-CM | POA: Diagnosis not present

## 2016-04-04 DIAGNOSIS — Z9981 Dependence on supplemental oxygen: Secondary | ICD-10-CM | POA: Diagnosis not present

## 2016-04-07 DIAGNOSIS — B349 Viral infection, unspecified: Secondary | ICD-10-CM | POA: Diagnosis not present

## 2016-04-07 DIAGNOSIS — I272 Pulmonary hypertension, unspecified: Secondary | ICD-10-CM | POA: Diagnosis not present

## 2016-04-07 DIAGNOSIS — Z09 Encounter for follow-up examination after completed treatment for conditions other than malignant neoplasm: Secondary | ICD-10-CM | POA: Diagnosis not present

## 2016-04-07 DIAGNOSIS — Z7901 Long term (current) use of anticoagulants: Secondary | ICD-10-CM | POA: Diagnosis not present

## 2016-04-07 DIAGNOSIS — I10 Essential (primary) hypertension: Secondary | ICD-10-CM | POA: Diagnosis not present

## 2016-04-07 DIAGNOSIS — E1143 Type 2 diabetes mellitus with diabetic autonomic (poly)neuropathy: Secondary | ICD-10-CM | POA: Diagnosis not present

## 2016-04-07 DIAGNOSIS — I5033 Acute on chronic diastolic (congestive) heart failure: Secondary | ICD-10-CM | POA: Diagnosis not present

## 2016-04-07 DIAGNOSIS — E119 Type 2 diabetes mellitus without complications: Secondary | ICD-10-CM | POA: Diagnosis not present

## 2016-04-07 DIAGNOSIS — I5032 Chronic diastolic (congestive) heart failure: Secondary | ICD-10-CM | POA: Diagnosis not present

## 2016-04-07 DIAGNOSIS — R0902 Hypoxemia: Secondary | ICD-10-CM | POA: Diagnosis not present

## 2016-04-07 DIAGNOSIS — J069 Acute upper respiratory infection, unspecified: Secondary | ICD-10-CM | POA: Diagnosis not present

## 2016-04-07 DIAGNOSIS — K219 Gastro-esophageal reflux disease without esophagitis: Secondary | ICD-10-CM | POA: Diagnosis not present

## 2016-04-07 DIAGNOSIS — I482 Chronic atrial fibrillation: Secondary | ICD-10-CM | POA: Diagnosis not present

## 2016-04-07 DIAGNOSIS — R739 Hyperglycemia, unspecified: Secondary | ICD-10-CM | POA: Diagnosis not present

## 2016-04-07 DIAGNOSIS — I110 Hypertensive heart disease with heart failure: Secondary | ICD-10-CM | POA: Diagnosis not present

## 2016-04-07 DIAGNOSIS — Z96651 Presence of right artificial knee joint: Secondary | ICD-10-CM | POA: Diagnosis not present

## 2016-04-07 DIAGNOSIS — Z9981 Dependence on supplemental oxygen: Secondary | ICD-10-CM | POA: Diagnosis not present

## 2016-04-07 DIAGNOSIS — J9601 Acute respiratory failure with hypoxia: Secondary | ICD-10-CM | POA: Diagnosis not present

## 2016-04-08 DIAGNOSIS — Z7901 Long term (current) use of anticoagulants: Secondary | ICD-10-CM | POA: Diagnosis not present

## 2016-04-08 DIAGNOSIS — I5033 Acute on chronic diastolic (congestive) heart failure: Secondary | ICD-10-CM | POA: Diagnosis not present

## 2016-04-08 DIAGNOSIS — B349 Viral infection, unspecified: Secondary | ICD-10-CM | POA: Diagnosis not present

## 2016-04-08 DIAGNOSIS — J9601 Acute respiratory failure with hypoxia: Secondary | ICD-10-CM | POA: Diagnosis not present

## 2016-04-08 DIAGNOSIS — I110 Hypertensive heart disease with heart failure: Secondary | ICD-10-CM | POA: Diagnosis not present

## 2016-04-08 DIAGNOSIS — I482 Chronic atrial fibrillation: Secondary | ICD-10-CM | POA: Diagnosis not present

## 2016-04-08 DIAGNOSIS — K219 Gastro-esophageal reflux disease without esophagitis: Secondary | ICD-10-CM | POA: Diagnosis not present

## 2016-04-08 DIAGNOSIS — Z96651 Presence of right artificial knee joint: Secondary | ICD-10-CM | POA: Diagnosis not present

## 2016-04-08 DIAGNOSIS — Z9981 Dependence on supplemental oxygen: Secondary | ICD-10-CM | POA: Diagnosis not present

## 2016-04-08 DIAGNOSIS — I272 Pulmonary hypertension, unspecified: Secondary | ICD-10-CM | POA: Diagnosis not present

## 2016-04-08 DIAGNOSIS — E119 Type 2 diabetes mellitus without complications: Secondary | ICD-10-CM | POA: Diagnosis not present

## 2016-04-09 DIAGNOSIS — I482 Chronic atrial fibrillation: Secondary | ICD-10-CM | POA: Diagnosis not present

## 2016-04-09 DIAGNOSIS — Z9981 Dependence on supplemental oxygen: Secondary | ICD-10-CM | POA: Diagnosis not present

## 2016-04-09 DIAGNOSIS — E119 Type 2 diabetes mellitus without complications: Secondary | ICD-10-CM | POA: Diagnosis not present

## 2016-04-09 DIAGNOSIS — Z96651 Presence of right artificial knee joint: Secondary | ICD-10-CM | POA: Diagnosis not present

## 2016-04-09 DIAGNOSIS — I110 Hypertensive heart disease with heart failure: Secondary | ICD-10-CM | POA: Diagnosis not present

## 2016-04-09 DIAGNOSIS — B349 Viral infection, unspecified: Secondary | ICD-10-CM | POA: Diagnosis not present

## 2016-04-09 DIAGNOSIS — J9601 Acute respiratory failure with hypoxia: Secondary | ICD-10-CM | POA: Diagnosis not present

## 2016-04-09 DIAGNOSIS — I272 Pulmonary hypertension, unspecified: Secondary | ICD-10-CM | POA: Diagnosis not present

## 2016-04-09 DIAGNOSIS — I5033 Acute on chronic diastolic (congestive) heart failure: Secondary | ICD-10-CM | POA: Diagnosis not present

## 2016-04-09 DIAGNOSIS — Z7901 Long term (current) use of anticoagulants: Secondary | ICD-10-CM | POA: Diagnosis not present

## 2016-04-09 DIAGNOSIS — K219 Gastro-esophageal reflux disease without esophagitis: Secondary | ICD-10-CM | POA: Diagnosis not present

## 2016-04-10 DIAGNOSIS — I110 Hypertensive heart disease with heart failure: Secondary | ICD-10-CM | POA: Diagnosis not present

## 2016-04-10 DIAGNOSIS — E119 Type 2 diabetes mellitus without complications: Secondary | ICD-10-CM | POA: Diagnosis not present

## 2016-04-10 DIAGNOSIS — I272 Pulmonary hypertension, unspecified: Secondary | ICD-10-CM | POA: Diagnosis not present

## 2016-04-10 DIAGNOSIS — K219 Gastro-esophageal reflux disease without esophagitis: Secondary | ICD-10-CM | POA: Diagnosis not present

## 2016-04-10 DIAGNOSIS — Z7901 Long term (current) use of anticoagulants: Secondary | ICD-10-CM | POA: Diagnosis not present

## 2016-04-10 DIAGNOSIS — Z9981 Dependence on supplemental oxygen: Secondary | ICD-10-CM | POA: Diagnosis not present

## 2016-04-10 DIAGNOSIS — I482 Chronic atrial fibrillation: Secondary | ICD-10-CM | POA: Diagnosis not present

## 2016-04-10 DIAGNOSIS — J9601 Acute respiratory failure with hypoxia: Secondary | ICD-10-CM | POA: Diagnosis not present

## 2016-04-10 DIAGNOSIS — B349 Viral infection, unspecified: Secondary | ICD-10-CM | POA: Diagnosis not present

## 2016-04-10 DIAGNOSIS — Z96651 Presence of right artificial knee joint: Secondary | ICD-10-CM | POA: Diagnosis not present

## 2016-04-10 DIAGNOSIS — I5033 Acute on chronic diastolic (congestive) heart failure: Secondary | ICD-10-CM | POA: Diagnosis not present

## 2016-04-14 DIAGNOSIS — I110 Hypertensive heart disease with heart failure: Secondary | ICD-10-CM | POA: Diagnosis not present

## 2016-04-14 DIAGNOSIS — Z96651 Presence of right artificial knee joint: Secondary | ICD-10-CM | POA: Diagnosis not present

## 2016-04-14 DIAGNOSIS — Z7901 Long term (current) use of anticoagulants: Secondary | ICD-10-CM | POA: Diagnosis not present

## 2016-04-14 DIAGNOSIS — I482 Chronic atrial fibrillation: Secondary | ICD-10-CM | POA: Diagnosis not present

## 2016-04-14 DIAGNOSIS — I272 Pulmonary hypertension, unspecified: Secondary | ICD-10-CM | POA: Diagnosis not present

## 2016-04-14 DIAGNOSIS — K219 Gastro-esophageal reflux disease without esophagitis: Secondary | ICD-10-CM | POA: Diagnosis not present

## 2016-04-14 DIAGNOSIS — B349 Viral infection, unspecified: Secondary | ICD-10-CM | POA: Diagnosis not present

## 2016-04-14 DIAGNOSIS — Z9981 Dependence on supplemental oxygen: Secondary | ICD-10-CM | POA: Diagnosis not present

## 2016-04-14 DIAGNOSIS — E119 Type 2 diabetes mellitus without complications: Secondary | ICD-10-CM | POA: Diagnosis not present

## 2016-04-14 DIAGNOSIS — J9601 Acute respiratory failure with hypoxia: Secondary | ICD-10-CM | POA: Diagnosis not present

## 2016-04-14 DIAGNOSIS — I5033 Acute on chronic diastolic (congestive) heart failure: Secondary | ICD-10-CM | POA: Diagnosis not present

## 2016-04-18 DIAGNOSIS — I5033 Acute on chronic diastolic (congestive) heart failure: Secondary | ICD-10-CM | POA: Diagnosis not present

## 2016-04-18 DIAGNOSIS — E119 Type 2 diabetes mellitus without complications: Secondary | ICD-10-CM | POA: Diagnosis not present

## 2016-04-18 DIAGNOSIS — J9601 Acute respiratory failure with hypoxia: Secondary | ICD-10-CM | POA: Diagnosis not present

## 2016-04-18 DIAGNOSIS — Z9981 Dependence on supplemental oxygen: Secondary | ICD-10-CM | POA: Diagnosis not present

## 2016-04-18 DIAGNOSIS — Z7901 Long term (current) use of anticoagulants: Secondary | ICD-10-CM | POA: Diagnosis not present

## 2016-04-18 DIAGNOSIS — B349 Viral infection, unspecified: Secondary | ICD-10-CM | POA: Diagnosis not present

## 2016-04-18 DIAGNOSIS — I482 Chronic atrial fibrillation: Secondary | ICD-10-CM | POA: Diagnosis not present

## 2016-04-18 DIAGNOSIS — I272 Pulmonary hypertension, unspecified: Secondary | ICD-10-CM | POA: Diagnosis not present

## 2016-04-18 DIAGNOSIS — I110 Hypertensive heart disease with heart failure: Secondary | ICD-10-CM | POA: Diagnosis not present

## 2016-04-18 DIAGNOSIS — K219 Gastro-esophageal reflux disease without esophagitis: Secondary | ICD-10-CM | POA: Diagnosis not present

## 2016-04-18 DIAGNOSIS — Z96651 Presence of right artificial knee joint: Secondary | ICD-10-CM | POA: Diagnosis not present

## 2016-04-21 DIAGNOSIS — K219 Gastro-esophageal reflux disease without esophagitis: Secondary | ICD-10-CM | POA: Diagnosis not present

## 2016-04-21 DIAGNOSIS — Z96651 Presence of right artificial knee joint: Secondary | ICD-10-CM | POA: Diagnosis not present

## 2016-04-21 DIAGNOSIS — I272 Pulmonary hypertension, unspecified: Secondary | ICD-10-CM | POA: Diagnosis not present

## 2016-04-21 DIAGNOSIS — N390 Urinary tract infection, site not specified: Secondary | ICD-10-CM | POA: Diagnosis not present

## 2016-04-21 DIAGNOSIS — E119 Type 2 diabetes mellitus without complications: Secondary | ICD-10-CM | POA: Diagnosis not present

## 2016-04-21 DIAGNOSIS — Z9981 Dependence on supplemental oxygen: Secondary | ICD-10-CM | POA: Diagnosis not present

## 2016-04-21 DIAGNOSIS — R828 Abnormal findings on cytological and histological examination of urine: Secondary | ICD-10-CM | POA: Diagnosis not present

## 2016-04-21 DIAGNOSIS — J9601 Acute respiratory failure with hypoxia: Secondary | ICD-10-CM | POA: Diagnosis not present

## 2016-04-21 DIAGNOSIS — B349 Viral infection, unspecified: Secondary | ICD-10-CM | POA: Diagnosis not present

## 2016-04-21 DIAGNOSIS — I482 Chronic atrial fibrillation: Secondary | ICD-10-CM | POA: Diagnosis not present

## 2016-04-21 DIAGNOSIS — I5033 Acute on chronic diastolic (congestive) heart failure: Secondary | ICD-10-CM | POA: Diagnosis not present

## 2016-04-21 DIAGNOSIS — Z7901 Long term (current) use of anticoagulants: Secondary | ICD-10-CM | POA: Diagnosis not present

## 2016-04-21 DIAGNOSIS — I110 Hypertensive heart disease with heart failure: Secondary | ICD-10-CM | POA: Diagnosis not present

## 2016-04-22 DIAGNOSIS — I10 Essential (primary) hypertension: Secondary | ICD-10-CM | POA: Diagnosis not present

## 2016-04-22 DIAGNOSIS — I482 Chronic atrial fibrillation: Secondary | ICD-10-CM | POA: Diagnosis not present

## 2016-04-22 DIAGNOSIS — Z8673 Personal history of transient ischemic attack (TIA), and cerebral infarction without residual deficits: Secondary | ICD-10-CM | POA: Diagnosis not present

## 2016-04-22 DIAGNOSIS — I5032 Chronic diastolic (congestive) heart failure: Secondary | ICD-10-CM | POA: Diagnosis not present

## 2016-04-23 DIAGNOSIS — B349 Viral infection, unspecified: Secondary | ICD-10-CM | POA: Diagnosis not present

## 2016-04-23 DIAGNOSIS — K219 Gastro-esophageal reflux disease without esophagitis: Secondary | ICD-10-CM | POA: Diagnosis not present

## 2016-04-23 DIAGNOSIS — I110 Hypertensive heart disease with heart failure: Secondary | ICD-10-CM | POA: Diagnosis not present

## 2016-04-23 DIAGNOSIS — Z7901 Long term (current) use of anticoagulants: Secondary | ICD-10-CM | POA: Diagnosis not present

## 2016-04-23 DIAGNOSIS — Z9981 Dependence on supplemental oxygen: Secondary | ICD-10-CM | POA: Diagnosis not present

## 2016-04-23 DIAGNOSIS — J9601 Acute respiratory failure with hypoxia: Secondary | ICD-10-CM | POA: Diagnosis not present

## 2016-04-23 DIAGNOSIS — Z96651 Presence of right artificial knee joint: Secondary | ICD-10-CM | POA: Diagnosis not present

## 2016-04-23 DIAGNOSIS — I5033 Acute on chronic diastolic (congestive) heart failure: Secondary | ICD-10-CM | POA: Diagnosis not present

## 2016-04-23 DIAGNOSIS — I272 Pulmonary hypertension, unspecified: Secondary | ICD-10-CM | POA: Diagnosis not present

## 2016-04-23 DIAGNOSIS — E119 Type 2 diabetes mellitus without complications: Secondary | ICD-10-CM | POA: Diagnosis not present

## 2016-04-23 DIAGNOSIS — I482 Chronic atrial fibrillation: Secondary | ICD-10-CM | POA: Diagnosis not present

## 2016-04-25 DIAGNOSIS — I110 Hypertensive heart disease with heart failure: Secondary | ICD-10-CM | POA: Diagnosis not present

## 2016-04-25 DIAGNOSIS — J9601 Acute respiratory failure with hypoxia: Secondary | ICD-10-CM | POA: Diagnosis not present

## 2016-04-25 DIAGNOSIS — E119 Type 2 diabetes mellitus without complications: Secondary | ICD-10-CM | POA: Diagnosis not present

## 2016-04-25 DIAGNOSIS — I272 Pulmonary hypertension, unspecified: Secondary | ICD-10-CM | POA: Diagnosis not present

## 2016-04-25 DIAGNOSIS — I482 Chronic atrial fibrillation: Secondary | ICD-10-CM | POA: Diagnosis not present

## 2016-04-25 DIAGNOSIS — Z7901 Long term (current) use of anticoagulants: Secondary | ICD-10-CM | POA: Diagnosis not present

## 2016-04-25 DIAGNOSIS — K219 Gastro-esophageal reflux disease without esophagitis: Secondary | ICD-10-CM | POA: Diagnosis not present

## 2016-04-25 DIAGNOSIS — I5033 Acute on chronic diastolic (congestive) heart failure: Secondary | ICD-10-CM | POA: Diagnosis not present

## 2016-04-25 DIAGNOSIS — Z9981 Dependence on supplemental oxygen: Secondary | ICD-10-CM | POA: Diagnosis not present

## 2016-04-25 DIAGNOSIS — B349 Viral infection, unspecified: Secondary | ICD-10-CM | POA: Diagnosis not present

## 2016-04-25 DIAGNOSIS — Z96651 Presence of right artificial knee joint: Secondary | ICD-10-CM | POA: Diagnosis not present

## 2016-04-28 DIAGNOSIS — I110 Hypertensive heart disease with heart failure: Secondary | ICD-10-CM | POA: Diagnosis not present

## 2016-04-28 DIAGNOSIS — E119 Type 2 diabetes mellitus without complications: Secondary | ICD-10-CM | POA: Diagnosis not present

## 2016-04-28 DIAGNOSIS — Z7901 Long term (current) use of anticoagulants: Secondary | ICD-10-CM | POA: Diagnosis not present

## 2016-04-28 DIAGNOSIS — J9601 Acute respiratory failure with hypoxia: Secondary | ICD-10-CM | POA: Diagnosis not present

## 2016-04-28 DIAGNOSIS — K219 Gastro-esophageal reflux disease without esophagitis: Secondary | ICD-10-CM | POA: Diagnosis not present

## 2016-04-28 DIAGNOSIS — Z96651 Presence of right artificial knee joint: Secondary | ICD-10-CM | POA: Diagnosis not present

## 2016-04-28 DIAGNOSIS — Z9981 Dependence on supplemental oxygen: Secondary | ICD-10-CM | POA: Diagnosis not present

## 2016-04-28 DIAGNOSIS — I5033 Acute on chronic diastolic (congestive) heart failure: Secondary | ICD-10-CM | POA: Diagnosis not present

## 2016-04-28 DIAGNOSIS — I482 Chronic atrial fibrillation: Secondary | ICD-10-CM | POA: Diagnosis not present

## 2016-04-28 DIAGNOSIS — I272 Pulmonary hypertension, unspecified: Secondary | ICD-10-CM | POA: Diagnosis not present

## 2016-04-28 DIAGNOSIS — B349 Viral infection, unspecified: Secondary | ICD-10-CM | POA: Diagnosis not present

## 2016-04-30 DIAGNOSIS — Z9981 Dependence on supplemental oxygen: Secondary | ICD-10-CM | POA: Diagnosis not present

## 2016-04-30 DIAGNOSIS — I482 Chronic atrial fibrillation: Secondary | ICD-10-CM | POA: Diagnosis not present

## 2016-04-30 DIAGNOSIS — K219 Gastro-esophageal reflux disease without esophagitis: Secondary | ICD-10-CM | POA: Diagnosis not present

## 2016-04-30 DIAGNOSIS — E119 Type 2 diabetes mellitus without complications: Secondary | ICD-10-CM | POA: Diagnosis not present

## 2016-04-30 DIAGNOSIS — Z96651 Presence of right artificial knee joint: Secondary | ICD-10-CM | POA: Diagnosis not present

## 2016-04-30 DIAGNOSIS — B349 Viral infection, unspecified: Secondary | ICD-10-CM | POA: Diagnosis not present

## 2016-04-30 DIAGNOSIS — J9601 Acute respiratory failure with hypoxia: Secondary | ICD-10-CM | POA: Diagnosis not present

## 2016-04-30 DIAGNOSIS — I5033 Acute on chronic diastolic (congestive) heart failure: Secondary | ICD-10-CM | POA: Diagnosis not present

## 2016-04-30 DIAGNOSIS — I110 Hypertensive heart disease with heart failure: Secondary | ICD-10-CM | POA: Diagnosis not present

## 2016-04-30 DIAGNOSIS — Z7901 Long term (current) use of anticoagulants: Secondary | ICD-10-CM | POA: Diagnosis not present

## 2016-04-30 DIAGNOSIS — I272 Pulmonary hypertension, unspecified: Secondary | ICD-10-CM | POA: Diagnosis not present

## 2016-05-02 DIAGNOSIS — I272 Pulmonary hypertension, unspecified: Secondary | ICD-10-CM | POA: Diagnosis not present

## 2016-05-02 DIAGNOSIS — I482 Chronic atrial fibrillation: Secondary | ICD-10-CM | POA: Diagnosis not present

## 2016-05-02 DIAGNOSIS — Z96651 Presence of right artificial knee joint: Secondary | ICD-10-CM | POA: Diagnosis not present

## 2016-05-02 DIAGNOSIS — R0602 Shortness of breath: Secondary | ICD-10-CM | POA: Diagnosis not present

## 2016-05-02 DIAGNOSIS — Z9981 Dependence on supplemental oxygen: Secondary | ICD-10-CM | POA: Diagnosis not present

## 2016-05-02 DIAGNOSIS — I110 Hypertensive heart disease with heart failure: Secondary | ICD-10-CM | POA: Diagnosis not present

## 2016-05-02 DIAGNOSIS — I5033 Acute on chronic diastolic (congestive) heart failure: Secondary | ICD-10-CM | POA: Diagnosis not present

## 2016-05-02 DIAGNOSIS — K219 Gastro-esophageal reflux disease without esophagitis: Secondary | ICD-10-CM | POA: Diagnosis not present

## 2016-05-02 DIAGNOSIS — E119 Type 2 diabetes mellitus without complications: Secondary | ICD-10-CM | POA: Diagnosis not present

## 2016-05-02 DIAGNOSIS — B349 Viral infection, unspecified: Secondary | ICD-10-CM | POA: Diagnosis not present

## 2016-05-02 DIAGNOSIS — Z7901 Long term (current) use of anticoagulants: Secondary | ICD-10-CM | POA: Diagnosis not present

## 2016-05-02 DIAGNOSIS — I5030 Unspecified diastolic (congestive) heart failure: Secondary | ICD-10-CM | POA: Diagnosis not present

## 2016-05-02 DIAGNOSIS — J9601 Acute respiratory failure with hypoxia: Secondary | ICD-10-CM | POA: Diagnosis not present

## 2016-05-05 DIAGNOSIS — Z9981 Dependence on supplemental oxygen: Secondary | ICD-10-CM | POA: Diagnosis not present

## 2016-05-05 DIAGNOSIS — B349 Viral infection, unspecified: Secondary | ICD-10-CM | POA: Diagnosis not present

## 2016-05-05 DIAGNOSIS — J9601 Acute respiratory failure with hypoxia: Secondary | ICD-10-CM | POA: Diagnosis not present

## 2016-05-05 DIAGNOSIS — K219 Gastro-esophageal reflux disease without esophagitis: Secondary | ICD-10-CM | POA: Diagnosis not present

## 2016-05-05 DIAGNOSIS — I110 Hypertensive heart disease with heart failure: Secondary | ICD-10-CM | POA: Diagnosis not present

## 2016-05-05 DIAGNOSIS — I482 Chronic atrial fibrillation: Secondary | ICD-10-CM | POA: Diagnosis not present

## 2016-05-05 DIAGNOSIS — E119 Type 2 diabetes mellitus without complications: Secondary | ICD-10-CM | POA: Diagnosis not present

## 2016-05-05 DIAGNOSIS — Z7901 Long term (current) use of anticoagulants: Secondary | ICD-10-CM | POA: Diagnosis not present

## 2016-05-05 DIAGNOSIS — I272 Pulmonary hypertension, unspecified: Secondary | ICD-10-CM | POA: Diagnosis not present

## 2016-05-05 DIAGNOSIS — I5033 Acute on chronic diastolic (congestive) heart failure: Secondary | ICD-10-CM | POA: Diagnosis not present

## 2016-05-05 DIAGNOSIS — Z96651 Presence of right artificial knee joint: Secondary | ICD-10-CM | POA: Diagnosis not present

## 2016-05-09 DIAGNOSIS — E119 Type 2 diabetes mellitus without complications: Secondary | ICD-10-CM | POA: Diagnosis not present

## 2016-05-09 DIAGNOSIS — I110 Hypertensive heart disease with heart failure: Secondary | ICD-10-CM | POA: Diagnosis not present

## 2016-05-09 DIAGNOSIS — I5033 Acute on chronic diastolic (congestive) heart failure: Secondary | ICD-10-CM | POA: Diagnosis not present

## 2016-05-09 DIAGNOSIS — J9601 Acute respiratory failure with hypoxia: Secondary | ICD-10-CM | POA: Diagnosis not present

## 2016-05-09 DIAGNOSIS — I272 Pulmonary hypertension, unspecified: Secondary | ICD-10-CM | POA: Diagnosis not present

## 2016-05-09 DIAGNOSIS — Z96651 Presence of right artificial knee joint: Secondary | ICD-10-CM | POA: Diagnosis not present

## 2016-05-09 DIAGNOSIS — I482 Chronic atrial fibrillation: Secondary | ICD-10-CM | POA: Diagnosis not present

## 2016-05-09 DIAGNOSIS — K219 Gastro-esophageal reflux disease without esophagitis: Secondary | ICD-10-CM | POA: Diagnosis not present

## 2016-05-09 DIAGNOSIS — Z7901 Long term (current) use of anticoagulants: Secondary | ICD-10-CM | POA: Diagnosis not present

## 2016-05-09 DIAGNOSIS — B349 Viral infection, unspecified: Secondary | ICD-10-CM | POA: Diagnosis not present

## 2016-05-09 DIAGNOSIS — Z9981 Dependence on supplemental oxygen: Secondary | ICD-10-CM | POA: Diagnosis not present

## 2016-05-11 DIAGNOSIS — I5033 Acute on chronic diastolic (congestive) heart failure: Secondary | ICD-10-CM | POA: Diagnosis not present

## 2016-05-11 DIAGNOSIS — Z9981 Dependence on supplemental oxygen: Secondary | ICD-10-CM | POA: Diagnosis not present

## 2016-05-11 DIAGNOSIS — E119 Type 2 diabetes mellitus without complications: Secondary | ICD-10-CM | POA: Diagnosis not present

## 2016-05-11 DIAGNOSIS — J9601 Acute respiratory failure with hypoxia: Secondary | ICD-10-CM | POA: Diagnosis not present

## 2016-05-11 DIAGNOSIS — I272 Pulmonary hypertension, unspecified: Secondary | ICD-10-CM | POA: Diagnosis not present

## 2016-05-11 DIAGNOSIS — I482 Chronic atrial fibrillation: Secondary | ICD-10-CM | POA: Diagnosis not present

## 2016-05-11 DIAGNOSIS — K219 Gastro-esophageal reflux disease without esophagitis: Secondary | ICD-10-CM | POA: Diagnosis not present

## 2016-05-11 DIAGNOSIS — B349 Viral infection, unspecified: Secondary | ICD-10-CM | POA: Diagnosis not present

## 2016-05-11 DIAGNOSIS — Z96651 Presence of right artificial knee joint: Secondary | ICD-10-CM | POA: Diagnosis not present

## 2016-05-11 DIAGNOSIS — I110 Hypertensive heart disease with heart failure: Secondary | ICD-10-CM | POA: Diagnosis not present

## 2016-05-11 DIAGNOSIS — Z7901 Long term (current) use of anticoagulants: Secondary | ICD-10-CM | POA: Diagnosis not present

## 2016-05-12 DIAGNOSIS — Z7901 Long term (current) use of anticoagulants: Secondary | ICD-10-CM | POA: Diagnosis not present

## 2016-05-12 DIAGNOSIS — K219 Gastro-esophageal reflux disease without esophagitis: Secondary | ICD-10-CM | POA: Diagnosis not present

## 2016-05-12 DIAGNOSIS — E119 Type 2 diabetes mellitus without complications: Secondary | ICD-10-CM | POA: Diagnosis not present

## 2016-05-12 DIAGNOSIS — Z9981 Dependence on supplemental oxygen: Secondary | ICD-10-CM | POA: Diagnosis not present

## 2016-05-12 DIAGNOSIS — I482 Chronic atrial fibrillation: Secondary | ICD-10-CM | POA: Diagnosis not present

## 2016-05-12 DIAGNOSIS — J9601 Acute respiratory failure with hypoxia: Secondary | ICD-10-CM | POA: Diagnosis not present

## 2016-05-12 DIAGNOSIS — B349 Viral infection, unspecified: Secondary | ICD-10-CM | POA: Diagnosis not present

## 2016-05-12 DIAGNOSIS — I272 Pulmonary hypertension, unspecified: Secondary | ICD-10-CM | POA: Diagnosis not present

## 2016-05-12 DIAGNOSIS — Z96651 Presence of right artificial knee joint: Secondary | ICD-10-CM | POA: Diagnosis not present

## 2016-05-12 DIAGNOSIS — I110 Hypertensive heart disease with heart failure: Secondary | ICD-10-CM | POA: Diagnosis not present

## 2016-05-12 DIAGNOSIS — I5033 Acute on chronic diastolic (congestive) heart failure: Secondary | ICD-10-CM | POA: Diagnosis not present

## 2016-05-14 DIAGNOSIS — Z9981 Dependence on supplemental oxygen: Secondary | ICD-10-CM | POA: Diagnosis not present

## 2016-05-14 DIAGNOSIS — J9601 Acute respiratory failure with hypoxia: Secondary | ICD-10-CM | POA: Diagnosis not present

## 2016-05-14 DIAGNOSIS — I272 Pulmonary hypertension, unspecified: Secondary | ICD-10-CM | POA: Diagnosis not present

## 2016-05-14 DIAGNOSIS — Z96651 Presence of right artificial knee joint: Secondary | ICD-10-CM | POA: Diagnosis not present

## 2016-05-14 DIAGNOSIS — Z7901 Long term (current) use of anticoagulants: Secondary | ICD-10-CM | POA: Diagnosis not present

## 2016-05-14 DIAGNOSIS — K219 Gastro-esophageal reflux disease without esophagitis: Secondary | ICD-10-CM | POA: Diagnosis not present

## 2016-05-14 DIAGNOSIS — I5033 Acute on chronic diastolic (congestive) heart failure: Secondary | ICD-10-CM | POA: Diagnosis not present

## 2016-05-14 DIAGNOSIS — B349 Viral infection, unspecified: Secondary | ICD-10-CM | POA: Diagnosis not present

## 2016-05-14 DIAGNOSIS — E119 Type 2 diabetes mellitus without complications: Secondary | ICD-10-CM | POA: Diagnosis not present

## 2016-05-14 DIAGNOSIS — I110 Hypertensive heart disease with heart failure: Secondary | ICD-10-CM | POA: Diagnosis not present

## 2016-05-14 DIAGNOSIS — I482 Chronic atrial fibrillation: Secondary | ICD-10-CM | POA: Diagnosis not present

## 2016-05-15 ENCOUNTER — Ambulatory Visit (INDEPENDENT_AMBULATORY_CARE_PROVIDER_SITE_OTHER): Payer: Medicare Other | Admitting: Sports Medicine

## 2016-05-15 DIAGNOSIS — M5136 Other intervertebral disc degeneration, lumbar region: Secondary | ICD-10-CM

## 2016-05-15 DIAGNOSIS — M51369 Other intervertebral disc degeneration, lumbar region without mention of lumbar back pain or lower extremity pain: Secondary | ICD-10-CM

## 2016-05-15 NOTE — Assessment & Plan Note (Signed)
Paperwork filled out for placement, listed multiple medical problems, from an orthopedic standpoint.

## 2016-05-15 NOTE — Progress Notes (Signed)
  Subjective:    CC: Needs paperwork filled out  HPI: Fulton Molelice is a pleasant but debilitated 83 year old female, she is being evaluated for placement and needs some paperwork filled out.  Past medical history:  Negative.  See flowsheet/record as well for more information.  Surgical history: Negative.  See flowsheet/record as well for more information.  Family history: Negative.  See flowsheet/record as well for more information.  Social history: Negative.  See flowsheet/record as well for more information.  Allergies, and medications have been entered into the medical record, reviewed, and no changes needed.   Review of Systems: No fevers, chills, night sweats, weight loss, chest pain, or shortness of breath.   Objective:    General: Well Developed, well nourished, and in no acute distress.  Neuro: Alert and oriented x3, extra-ocular muscles intact, sensation grossly intact.  HEENT: Normocephalic, atraumatic, pupils equal round reactive to light, neck supple, no masses, no lymphadenopathy, thyroid nonpalpable.  Skin: Warm and dry, no rashes. Cardiac: Regular rate and rhythm, no murmurs rubs or gallops, no lower extremity edema.  Respiratory: Clear to auscultation bilaterally. Not using accessory muscles, speaking in full sentences.  Impression and Recommendations:    Lumbar degenerative disc disease Paperwork filled out for placement, listed multiple medical problems, from an orthopedic standpoint.  I spent 25 minutes with this patient, greater than 50% was face-to-face time counseling regarding the above diagnoses

## 2016-05-16 DIAGNOSIS — Z7901 Long term (current) use of anticoagulants: Secondary | ICD-10-CM | POA: Diagnosis not present

## 2016-05-16 DIAGNOSIS — I110 Hypertensive heart disease with heart failure: Secondary | ICD-10-CM | POA: Diagnosis not present

## 2016-05-16 DIAGNOSIS — I482 Chronic atrial fibrillation: Secondary | ICD-10-CM | POA: Diagnosis not present

## 2016-05-16 DIAGNOSIS — J9601 Acute respiratory failure with hypoxia: Secondary | ICD-10-CM | POA: Diagnosis not present

## 2016-05-16 DIAGNOSIS — B349 Viral infection, unspecified: Secondary | ICD-10-CM | POA: Diagnosis not present

## 2016-05-16 DIAGNOSIS — E119 Type 2 diabetes mellitus without complications: Secondary | ICD-10-CM | POA: Diagnosis not present

## 2016-05-16 DIAGNOSIS — I272 Pulmonary hypertension, unspecified: Secondary | ICD-10-CM | POA: Diagnosis not present

## 2016-05-16 DIAGNOSIS — Z96651 Presence of right artificial knee joint: Secondary | ICD-10-CM | POA: Diagnosis not present

## 2016-05-16 DIAGNOSIS — K219 Gastro-esophageal reflux disease without esophagitis: Secondary | ICD-10-CM | POA: Diagnosis not present

## 2016-05-16 DIAGNOSIS — I5033 Acute on chronic diastolic (congestive) heart failure: Secondary | ICD-10-CM | POA: Diagnosis not present

## 2016-05-16 DIAGNOSIS — Z9981 Dependence on supplemental oxygen: Secondary | ICD-10-CM | POA: Diagnosis not present

## 2016-05-19 DIAGNOSIS — K219 Gastro-esophageal reflux disease without esophagitis: Secondary | ICD-10-CM | POA: Diagnosis not present

## 2016-05-19 DIAGNOSIS — I272 Pulmonary hypertension, unspecified: Secondary | ICD-10-CM | POA: Diagnosis not present

## 2016-05-19 DIAGNOSIS — Z7901 Long term (current) use of anticoagulants: Secondary | ICD-10-CM | POA: Diagnosis not present

## 2016-05-19 DIAGNOSIS — Z9981 Dependence on supplemental oxygen: Secondary | ICD-10-CM | POA: Diagnosis not present

## 2016-05-19 DIAGNOSIS — I5033 Acute on chronic diastolic (congestive) heart failure: Secondary | ICD-10-CM | POA: Diagnosis not present

## 2016-05-19 DIAGNOSIS — I482 Chronic atrial fibrillation: Secondary | ICD-10-CM | POA: Diagnosis not present

## 2016-05-19 DIAGNOSIS — J9601 Acute respiratory failure with hypoxia: Secondary | ICD-10-CM | POA: Diagnosis not present

## 2016-05-19 DIAGNOSIS — I110 Hypertensive heart disease with heart failure: Secondary | ICD-10-CM | POA: Diagnosis not present

## 2016-05-19 DIAGNOSIS — B349 Viral infection, unspecified: Secondary | ICD-10-CM | POA: Diagnosis not present

## 2016-05-19 DIAGNOSIS — E119 Type 2 diabetes mellitus without complications: Secondary | ICD-10-CM | POA: Diagnosis not present

## 2016-05-19 DIAGNOSIS — Z96651 Presence of right artificial knee joint: Secondary | ICD-10-CM | POA: Diagnosis not present

## 2016-05-21 DIAGNOSIS — I482 Chronic atrial fibrillation: Secondary | ICD-10-CM | POA: Diagnosis not present

## 2016-05-21 DIAGNOSIS — Z7901 Long term (current) use of anticoagulants: Secondary | ICD-10-CM | POA: Diagnosis not present

## 2016-05-21 DIAGNOSIS — Z9981 Dependence on supplemental oxygen: Secondary | ICD-10-CM | POA: Diagnosis not present

## 2016-05-21 DIAGNOSIS — K219 Gastro-esophageal reflux disease without esophagitis: Secondary | ICD-10-CM | POA: Diagnosis not present

## 2016-05-21 DIAGNOSIS — E119 Type 2 diabetes mellitus without complications: Secondary | ICD-10-CM | POA: Diagnosis not present

## 2016-05-21 DIAGNOSIS — Z96651 Presence of right artificial knee joint: Secondary | ICD-10-CM | POA: Diagnosis not present

## 2016-05-21 DIAGNOSIS — I272 Pulmonary hypertension, unspecified: Secondary | ICD-10-CM | POA: Diagnosis not present

## 2016-05-21 DIAGNOSIS — I110 Hypertensive heart disease with heart failure: Secondary | ICD-10-CM | POA: Diagnosis not present

## 2016-05-21 DIAGNOSIS — J9601 Acute respiratory failure with hypoxia: Secondary | ICD-10-CM | POA: Diagnosis not present

## 2016-05-21 DIAGNOSIS — B349 Viral infection, unspecified: Secondary | ICD-10-CM | POA: Diagnosis not present

## 2016-05-21 DIAGNOSIS — I5033 Acute on chronic diastolic (congestive) heart failure: Secondary | ICD-10-CM | POA: Diagnosis not present

## 2016-05-22 DIAGNOSIS — Z9981 Dependence on supplemental oxygen: Secondary | ICD-10-CM | POA: Diagnosis not present

## 2016-05-22 DIAGNOSIS — I482 Chronic atrial fibrillation: Secondary | ICD-10-CM | POA: Diagnosis not present

## 2016-05-22 DIAGNOSIS — K219 Gastro-esophageal reflux disease without esophagitis: Secondary | ICD-10-CM | POA: Diagnosis not present

## 2016-05-22 DIAGNOSIS — I5033 Acute on chronic diastolic (congestive) heart failure: Secondary | ICD-10-CM | POA: Diagnosis not present

## 2016-05-22 DIAGNOSIS — J9601 Acute respiratory failure with hypoxia: Secondary | ICD-10-CM | POA: Diagnosis not present

## 2016-05-22 DIAGNOSIS — B349 Viral infection, unspecified: Secondary | ICD-10-CM | POA: Diagnosis not present

## 2016-05-22 DIAGNOSIS — Z96651 Presence of right artificial knee joint: Secondary | ICD-10-CM | POA: Diagnosis not present

## 2016-05-22 DIAGNOSIS — I272 Pulmonary hypertension, unspecified: Secondary | ICD-10-CM | POA: Diagnosis not present

## 2016-05-22 DIAGNOSIS — I110 Hypertensive heart disease with heart failure: Secondary | ICD-10-CM | POA: Diagnosis not present

## 2016-05-22 DIAGNOSIS — E119 Type 2 diabetes mellitus without complications: Secondary | ICD-10-CM | POA: Diagnosis not present

## 2016-05-22 DIAGNOSIS — Z7901 Long term (current) use of anticoagulants: Secondary | ICD-10-CM | POA: Diagnosis not present

## 2016-05-26 DIAGNOSIS — E119 Type 2 diabetes mellitus without complications: Secondary | ICD-10-CM | POA: Diagnosis not present

## 2016-05-26 DIAGNOSIS — I110 Hypertensive heart disease with heart failure: Secondary | ICD-10-CM | POA: Diagnosis not present

## 2016-05-26 DIAGNOSIS — I272 Pulmonary hypertension, unspecified: Secondary | ICD-10-CM | POA: Diagnosis not present

## 2016-05-26 DIAGNOSIS — B349 Viral infection, unspecified: Secondary | ICD-10-CM | POA: Diagnosis not present

## 2016-05-26 DIAGNOSIS — I5033 Acute on chronic diastolic (congestive) heart failure: Secondary | ICD-10-CM | POA: Diagnosis not present

## 2016-05-26 DIAGNOSIS — I482 Chronic atrial fibrillation: Secondary | ICD-10-CM | POA: Diagnosis not present

## 2016-05-26 DIAGNOSIS — Z7901 Long term (current) use of anticoagulants: Secondary | ICD-10-CM | POA: Diagnosis not present

## 2016-05-26 DIAGNOSIS — K219 Gastro-esophageal reflux disease without esophagitis: Secondary | ICD-10-CM | POA: Diagnosis not present

## 2016-05-26 DIAGNOSIS — Z9981 Dependence on supplemental oxygen: Secondary | ICD-10-CM | POA: Diagnosis not present

## 2016-05-26 DIAGNOSIS — Z96651 Presence of right artificial knee joint: Secondary | ICD-10-CM | POA: Diagnosis not present

## 2016-05-26 DIAGNOSIS — J9601 Acute respiratory failure with hypoxia: Secondary | ICD-10-CM | POA: Diagnosis not present

## 2016-05-29 DIAGNOSIS — Z96651 Presence of right artificial knee joint: Secondary | ICD-10-CM | POA: Diagnosis not present

## 2016-05-29 DIAGNOSIS — I272 Pulmonary hypertension, unspecified: Secondary | ICD-10-CM | POA: Diagnosis not present

## 2016-05-29 DIAGNOSIS — J9601 Acute respiratory failure with hypoxia: Secondary | ICD-10-CM | POA: Diagnosis not present

## 2016-05-29 DIAGNOSIS — B349 Viral infection, unspecified: Secondary | ICD-10-CM | POA: Diagnosis not present

## 2016-05-29 DIAGNOSIS — I482 Chronic atrial fibrillation: Secondary | ICD-10-CM | POA: Diagnosis not present

## 2016-05-29 DIAGNOSIS — Z9981 Dependence on supplemental oxygen: Secondary | ICD-10-CM | POA: Diagnosis not present

## 2016-05-29 DIAGNOSIS — I110 Hypertensive heart disease with heart failure: Secondary | ICD-10-CM | POA: Diagnosis not present

## 2016-05-29 DIAGNOSIS — K219 Gastro-esophageal reflux disease without esophagitis: Secondary | ICD-10-CM | POA: Diagnosis not present

## 2016-05-29 DIAGNOSIS — I5033 Acute on chronic diastolic (congestive) heart failure: Secondary | ICD-10-CM | POA: Diagnosis not present

## 2016-05-29 DIAGNOSIS — Z7901 Long term (current) use of anticoagulants: Secondary | ICD-10-CM | POA: Diagnosis not present

## 2016-05-29 DIAGNOSIS — E119 Type 2 diabetes mellitus without complications: Secondary | ICD-10-CM | POA: Diagnosis not present

## 2016-05-30 DIAGNOSIS — I5030 Unspecified diastolic (congestive) heart failure: Secondary | ICD-10-CM | POA: Diagnosis not present

## 2016-05-30 DIAGNOSIS — R0602 Shortness of breath: Secondary | ICD-10-CM | POA: Diagnosis not present

## 2016-06-04 DIAGNOSIS — K219 Gastro-esophageal reflux disease without esophagitis: Secondary | ICD-10-CM | POA: Diagnosis not present

## 2016-06-04 DIAGNOSIS — Z96651 Presence of right artificial knee joint: Secondary | ICD-10-CM | POA: Diagnosis not present

## 2016-06-04 DIAGNOSIS — I5033 Acute on chronic diastolic (congestive) heart failure: Secondary | ICD-10-CM | POA: Diagnosis not present

## 2016-06-04 DIAGNOSIS — E119 Type 2 diabetes mellitus without complications: Secondary | ICD-10-CM | POA: Diagnosis not present

## 2016-06-04 DIAGNOSIS — B349 Viral infection, unspecified: Secondary | ICD-10-CM | POA: Diagnosis not present

## 2016-06-04 DIAGNOSIS — J9601 Acute respiratory failure with hypoxia: Secondary | ICD-10-CM | POA: Diagnosis not present

## 2016-06-04 DIAGNOSIS — Z7901 Long term (current) use of anticoagulants: Secondary | ICD-10-CM | POA: Diagnosis not present

## 2016-06-04 DIAGNOSIS — Z9981 Dependence on supplemental oxygen: Secondary | ICD-10-CM | POA: Diagnosis not present

## 2016-06-04 DIAGNOSIS — I110 Hypertensive heart disease with heart failure: Secondary | ICD-10-CM | POA: Diagnosis not present

## 2016-06-04 DIAGNOSIS — I482 Chronic atrial fibrillation: Secondary | ICD-10-CM | POA: Diagnosis not present

## 2016-06-04 DIAGNOSIS — I272 Pulmonary hypertension, unspecified: Secondary | ICD-10-CM | POA: Diagnosis not present

## 2016-06-11 DIAGNOSIS — K219 Gastro-esophageal reflux disease without esophagitis: Secondary | ICD-10-CM | POA: Diagnosis not present

## 2016-06-11 DIAGNOSIS — B349 Viral infection, unspecified: Secondary | ICD-10-CM | POA: Diagnosis not present

## 2016-06-11 DIAGNOSIS — E119 Type 2 diabetes mellitus without complications: Secondary | ICD-10-CM | POA: Diagnosis not present

## 2016-06-11 DIAGNOSIS — I5033 Acute on chronic diastolic (congestive) heart failure: Secondary | ICD-10-CM | POA: Diagnosis not present

## 2016-06-11 DIAGNOSIS — I110 Hypertensive heart disease with heart failure: Secondary | ICD-10-CM | POA: Diagnosis not present

## 2016-06-11 DIAGNOSIS — Z9981 Dependence on supplemental oxygen: Secondary | ICD-10-CM | POA: Diagnosis not present

## 2016-06-11 DIAGNOSIS — I482 Chronic atrial fibrillation: Secondary | ICD-10-CM | POA: Diagnosis not present

## 2016-06-11 DIAGNOSIS — Z96651 Presence of right artificial knee joint: Secondary | ICD-10-CM | POA: Diagnosis not present

## 2016-06-11 DIAGNOSIS — I272 Pulmonary hypertension, unspecified: Secondary | ICD-10-CM | POA: Diagnosis not present

## 2016-06-11 DIAGNOSIS — Z7901 Long term (current) use of anticoagulants: Secondary | ICD-10-CM | POA: Diagnosis not present

## 2016-06-11 DIAGNOSIS — J9601 Acute respiratory failure with hypoxia: Secondary | ICD-10-CM | POA: Diagnosis not present

## 2016-06-19 ENCOUNTER — Other Ambulatory Visit: Payer: Self-pay

## 2016-06-19 DIAGNOSIS — M5136 Other intervertebral disc degeneration, lumbar region: Secondary | ICD-10-CM

## 2016-06-19 MED ORDER — GABAPENTIN 300 MG PO CAPS: 600.0000 mg | ORAL_CAPSULE | Freq: Every day | ORAL | 3 refills | Status: DC

## 2016-06-20 DIAGNOSIS — Z96651 Presence of right artificial knee joint: Secondary | ICD-10-CM | POA: Diagnosis not present

## 2016-06-20 DIAGNOSIS — E119 Type 2 diabetes mellitus without complications: Secondary | ICD-10-CM | POA: Diagnosis not present

## 2016-06-20 DIAGNOSIS — K219 Gastro-esophageal reflux disease without esophagitis: Secondary | ICD-10-CM | POA: Diagnosis not present

## 2016-06-20 DIAGNOSIS — I110 Hypertensive heart disease with heart failure: Secondary | ICD-10-CM | POA: Diagnosis not present

## 2016-06-20 DIAGNOSIS — Z7901 Long term (current) use of anticoagulants: Secondary | ICD-10-CM | POA: Diagnosis not present

## 2016-06-20 DIAGNOSIS — Z9981 Dependence on supplemental oxygen: Secondary | ICD-10-CM | POA: Diagnosis not present

## 2016-06-20 DIAGNOSIS — J9601 Acute respiratory failure with hypoxia: Secondary | ICD-10-CM | POA: Diagnosis not present

## 2016-06-20 DIAGNOSIS — B349 Viral infection, unspecified: Secondary | ICD-10-CM | POA: Diagnosis not present

## 2016-06-20 DIAGNOSIS — I272 Pulmonary hypertension, unspecified: Secondary | ICD-10-CM | POA: Diagnosis not present

## 2016-06-20 DIAGNOSIS — I5033 Acute on chronic diastolic (congestive) heart failure: Secondary | ICD-10-CM | POA: Diagnosis not present

## 2016-06-20 DIAGNOSIS — I482 Chronic atrial fibrillation: Secondary | ICD-10-CM | POA: Diagnosis not present

## 2016-06-23 ENCOUNTER — Other Ambulatory Visit: Payer: Self-pay

## 2016-06-23 DIAGNOSIS — Z9981 Dependence on supplemental oxygen: Secondary | ICD-10-CM | POA: Diagnosis not present

## 2016-06-23 DIAGNOSIS — I110 Hypertensive heart disease with heart failure: Secondary | ICD-10-CM | POA: Diagnosis not present

## 2016-06-23 DIAGNOSIS — Z96651 Presence of right artificial knee joint: Secondary | ICD-10-CM | POA: Diagnosis not present

## 2016-06-23 DIAGNOSIS — I5033 Acute on chronic diastolic (congestive) heart failure: Secondary | ICD-10-CM | POA: Diagnosis not present

## 2016-06-23 DIAGNOSIS — Z7901 Long term (current) use of anticoagulants: Secondary | ICD-10-CM | POA: Diagnosis not present

## 2016-06-23 DIAGNOSIS — K219 Gastro-esophageal reflux disease without esophagitis: Secondary | ICD-10-CM | POA: Diagnosis not present

## 2016-06-23 DIAGNOSIS — B349 Viral infection, unspecified: Secondary | ICD-10-CM | POA: Diagnosis not present

## 2016-06-23 DIAGNOSIS — E119 Type 2 diabetes mellitus without complications: Secondary | ICD-10-CM | POA: Diagnosis not present

## 2016-06-23 DIAGNOSIS — M5136 Other intervertebral disc degeneration, lumbar region: Secondary | ICD-10-CM

## 2016-06-23 DIAGNOSIS — I482 Chronic atrial fibrillation: Secondary | ICD-10-CM | POA: Diagnosis not present

## 2016-06-23 DIAGNOSIS — J9601 Acute respiratory failure with hypoxia: Secondary | ICD-10-CM | POA: Diagnosis not present

## 2016-06-23 DIAGNOSIS — I272 Pulmonary hypertension, unspecified: Secondary | ICD-10-CM | POA: Diagnosis not present

## 2016-06-23 MED ORDER — GABAPENTIN 300 MG PO CAPS: 600.0000 mg | ORAL_CAPSULE | Freq: Every day | ORAL | 3 refills | Status: DC

## 2016-06-30 DIAGNOSIS — R0602 Shortness of breath: Secondary | ICD-10-CM | POA: Diagnosis not present

## 2016-06-30 DIAGNOSIS — I5030 Unspecified diastolic (congestive) heart failure: Secondary | ICD-10-CM | POA: Diagnosis not present

## 2016-07-02 DIAGNOSIS — Z9981 Dependence on supplemental oxygen: Secondary | ICD-10-CM | POA: Diagnosis not present

## 2016-07-02 DIAGNOSIS — K219 Gastro-esophageal reflux disease without esophagitis: Secondary | ICD-10-CM | POA: Diagnosis not present

## 2016-07-02 DIAGNOSIS — I482 Chronic atrial fibrillation: Secondary | ICD-10-CM | POA: Diagnosis not present

## 2016-07-02 DIAGNOSIS — E119 Type 2 diabetes mellitus without complications: Secondary | ICD-10-CM | POA: Diagnosis not present

## 2016-07-02 DIAGNOSIS — I272 Pulmonary hypertension, unspecified: Secondary | ICD-10-CM | POA: Diagnosis not present

## 2016-07-02 DIAGNOSIS — B349 Viral infection, unspecified: Secondary | ICD-10-CM | POA: Diagnosis not present

## 2016-07-02 DIAGNOSIS — Z7901 Long term (current) use of anticoagulants: Secondary | ICD-10-CM | POA: Diagnosis not present

## 2016-07-02 DIAGNOSIS — J9601 Acute respiratory failure with hypoxia: Secondary | ICD-10-CM | POA: Diagnosis not present

## 2016-07-02 DIAGNOSIS — I110 Hypertensive heart disease with heart failure: Secondary | ICD-10-CM | POA: Diagnosis not present

## 2016-07-02 DIAGNOSIS — Z96651 Presence of right artificial knee joint: Secondary | ICD-10-CM | POA: Diagnosis not present

## 2016-07-02 DIAGNOSIS — I5033 Acute on chronic diastolic (congestive) heart failure: Secondary | ICD-10-CM | POA: Diagnosis not present

## 2016-07-03 DIAGNOSIS — K219 Gastro-esophageal reflux disease without esophagitis: Secondary | ICD-10-CM | POA: Diagnosis not present

## 2016-07-03 DIAGNOSIS — Z7901 Long term (current) use of anticoagulants: Secondary | ICD-10-CM | POA: Diagnosis not present

## 2016-07-03 DIAGNOSIS — J9601 Acute respiratory failure with hypoxia: Secondary | ICD-10-CM | POA: Diagnosis not present

## 2016-07-03 DIAGNOSIS — I110 Hypertensive heart disease with heart failure: Secondary | ICD-10-CM | POA: Diagnosis not present

## 2016-07-03 DIAGNOSIS — I272 Pulmonary hypertension, unspecified: Secondary | ICD-10-CM | POA: Diagnosis not present

## 2016-07-03 DIAGNOSIS — B349 Viral infection, unspecified: Secondary | ICD-10-CM | POA: Diagnosis not present

## 2016-07-03 DIAGNOSIS — Z9981 Dependence on supplemental oxygen: Secondary | ICD-10-CM | POA: Diagnosis not present

## 2016-07-03 DIAGNOSIS — Z96651 Presence of right artificial knee joint: Secondary | ICD-10-CM | POA: Diagnosis not present

## 2016-07-03 DIAGNOSIS — I482 Chronic atrial fibrillation: Secondary | ICD-10-CM | POA: Diagnosis not present

## 2016-07-03 DIAGNOSIS — E119 Type 2 diabetes mellitus without complications: Secondary | ICD-10-CM | POA: Diagnosis not present

## 2016-07-03 DIAGNOSIS — I5033 Acute on chronic diastolic (congestive) heart failure: Secondary | ICD-10-CM | POA: Diagnosis not present

## 2016-07-11 DIAGNOSIS — J9601 Acute respiratory failure with hypoxia: Secondary | ICD-10-CM | POA: Diagnosis not present

## 2016-07-11 DIAGNOSIS — K219 Gastro-esophageal reflux disease without esophagitis: Secondary | ICD-10-CM | POA: Diagnosis not present

## 2016-07-11 DIAGNOSIS — Z96651 Presence of right artificial knee joint: Secondary | ICD-10-CM | POA: Diagnosis not present

## 2016-07-11 DIAGNOSIS — E119 Type 2 diabetes mellitus without complications: Secondary | ICD-10-CM | POA: Diagnosis not present

## 2016-07-11 DIAGNOSIS — I272 Pulmonary hypertension, unspecified: Secondary | ICD-10-CM | POA: Diagnosis not present

## 2016-07-11 DIAGNOSIS — B349 Viral infection, unspecified: Secondary | ICD-10-CM | POA: Diagnosis not present

## 2016-07-11 DIAGNOSIS — Z9981 Dependence on supplemental oxygen: Secondary | ICD-10-CM | POA: Diagnosis not present

## 2016-07-11 DIAGNOSIS — I482 Chronic atrial fibrillation: Secondary | ICD-10-CM | POA: Diagnosis not present

## 2016-07-11 DIAGNOSIS — I110 Hypertensive heart disease with heart failure: Secondary | ICD-10-CM | POA: Diagnosis not present

## 2016-07-11 DIAGNOSIS — I5033 Acute on chronic diastolic (congestive) heart failure: Secondary | ICD-10-CM | POA: Diagnosis not present

## 2016-07-11 DIAGNOSIS — Z7901 Long term (current) use of anticoagulants: Secondary | ICD-10-CM | POA: Diagnosis not present

## 2016-07-18 DIAGNOSIS — R300 Dysuria: Secondary | ICD-10-CM | POA: Diagnosis not present

## 2016-07-21 DIAGNOSIS — I272 Pulmonary hypertension, unspecified: Secondary | ICD-10-CM | POA: Diagnosis not present

## 2016-07-21 DIAGNOSIS — I482 Chronic atrial fibrillation: Secondary | ICD-10-CM | POA: Diagnosis not present

## 2016-07-21 DIAGNOSIS — E119 Type 2 diabetes mellitus without complications: Secondary | ICD-10-CM | POA: Diagnosis not present

## 2016-07-21 DIAGNOSIS — Z7901 Long term (current) use of anticoagulants: Secondary | ICD-10-CM | POA: Diagnosis not present

## 2016-07-21 DIAGNOSIS — K219 Gastro-esophageal reflux disease without esophagitis: Secondary | ICD-10-CM | POA: Diagnosis not present

## 2016-07-21 DIAGNOSIS — Z96651 Presence of right artificial knee joint: Secondary | ICD-10-CM | POA: Diagnosis not present

## 2016-07-21 DIAGNOSIS — I110 Hypertensive heart disease with heart failure: Secondary | ICD-10-CM | POA: Diagnosis not present

## 2016-07-21 DIAGNOSIS — B349 Viral infection, unspecified: Secondary | ICD-10-CM | POA: Diagnosis not present

## 2016-07-21 DIAGNOSIS — J9601 Acute respiratory failure with hypoxia: Secondary | ICD-10-CM | POA: Diagnosis not present

## 2016-07-21 DIAGNOSIS — Z9981 Dependence on supplemental oxygen: Secondary | ICD-10-CM | POA: Diagnosis not present

## 2016-07-21 DIAGNOSIS — I5033 Acute on chronic diastolic (congestive) heart failure: Secondary | ICD-10-CM | POA: Diagnosis not present

## 2016-07-28 DIAGNOSIS — E7800 Pure hypercholesterolemia, unspecified: Secondary | ICD-10-CM | POA: Diagnosis not present

## 2016-07-28 DIAGNOSIS — E039 Hypothyroidism, unspecified: Secondary | ICD-10-CM | POA: Diagnosis not present

## 2016-07-28 DIAGNOSIS — I10 Essential (primary) hypertension: Secondary | ICD-10-CM | POA: Diagnosis not present

## 2016-07-28 DIAGNOSIS — R5383 Other fatigue: Secondary | ICD-10-CM | POA: Diagnosis not present

## 2016-07-28 DIAGNOSIS — E1143 Type 2 diabetes mellitus with diabetic autonomic (poly)neuropathy: Secondary | ICD-10-CM | POA: Diagnosis not present

## 2016-07-30 DIAGNOSIS — R0602 Shortness of breath: Secondary | ICD-10-CM | POA: Diagnosis not present

## 2016-07-30 DIAGNOSIS — I5030 Unspecified diastolic (congestive) heart failure: Secondary | ICD-10-CM | POA: Diagnosis not present

## 2016-08-13 DIAGNOSIS — R300 Dysuria: Secondary | ICD-10-CM | POA: Diagnosis not present

## 2016-08-19 ENCOUNTER — Encounter: Payer: Self-pay | Admitting: *Deleted

## 2016-08-19 ENCOUNTER — Emergency Department
Admission: EM | Admit: 2016-08-19 | Discharge: 2016-08-19 | Disposition: A | Discharge status: Home / Self Care | Payer: Medicare Other | Source: Home / Self Care | Attending: Family Medicine | Admitting: Family Medicine

## 2016-08-19 DIAGNOSIS — L0291 Cutaneous abscess, unspecified: Secondary | ICD-10-CM

## 2016-08-19 DIAGNOSIS — L723 Sebaceous cyst: Secondary | ICD-10-CM | POA: Diagnosis not present

## 2016-08-19 DIAGNOSIS — L089 Local infection of the skin and subcutaneous tissue, unspecified: Secondary | ICD-10-CM

## 2016-08-19 MED ORDER — DOXYCYCLINE HYCLATE 100 MG PO CAPS: 100.0000 mg | ORAL_CAPSULE | Freq: Two times a day (BID) | ORAL | 0 refills | Status: DC

## 2016-08-19 NOTE — ED Provider Notes (Signed)
[facility]    CSN: [id number] Arrival date & time: 08/19/16  1025     History   Chief Complaint Chief Complaint  Patient presents with  . Abscess    HPI Mary Travis is a 83 y.o. female.   Patient complains of development of a painful abscess on her right upper back during the past 4 to 5 days.  There has been no drainage from the lesion.  No fevers, chills, and sweats.   The history is provided by the patient and a relative.  Abscess  Abscess location: right upper back. Size:  4cm by 8cm Abscess quality: fluctuance, painful, redness and warmth   Abscess quality: not draining   Duration:  5 days Progression:  Worsening Pain details:    Quality:  Dull   Severity:  Moderate   Duration:  5 days   Timing:  Constant   Progression:  Worsening Chronicity:  Chronic Relieved by:  Nothing Exacerbated by: contact. Ineffective treatments:  Warm compresses Associated symptoms: no fatigue, no fever and no nausea     Past Medical History:  Diagnosis Date  . Atrial fibrillation (HCC)   . CHF (congestive heart failure) (HCC)   . Depression   . Diabetes 1.5, managed as type 2 (HCC)   . GERD (gastroesophageal reflux disease)   . Hyperlipidemia   . Hypertension   . Seasonal allergies   . Thyroid disease     Patient Active Problem List   Diagnosis Date Noted  . Fall 09/26/2015  . Head injury 09/26/2015  . Injury of right hand 09/26/2015  . Right leg injury 09/26/2015  . Left ankle pain 03/02/2015  . Chronic systolic heart failure (HCC) 07/06/2014  . TI (tricuspid incompetence) 05/09/2014  . Hypertensive pulmonary vascular disease (HCC) 05/09/2014  . MI (mitral incompetence) 05/09/2014  . Cardiomyopathy (HCC) 04/13/2014  . AF (paroxysmal atrial fibrillation) (HCC) 03/02/2014  . Lumbar degenerative disc disease 02/20/2014  . Trochanteric bursitis of right hip 01/31/2014  . Osteoarthritis of right hip 01/12/2014  . Venous stasis dermatitis 01/12/2014  .  Episode of syncope 08/17/2013  . Closed fracture of ankle, medial malleolus 08/17/2013  . Closed fracture of metatarsal bone 08/17/2013  . Diabetes mellitus, type 2 (HCC) 12/21/2012  . Hypothyroidism 12/29/2009  . DM2 (diabetes mellitus, type 2) (HCC) 12/29/2009  . HYPERLIPIDEMIA 12/29/2009  . Essential hypertension 12/29/2009    Past Surgical History:  Procedure Laterality Date  . BACK SURGERY    . KNEE ARTHROCENTESIS      OB History    No data available       Home Medications    Prior to Admission medications   Medication Sig Start Date End Date Taking? Authorizing Provider  AMBULATORY NON FORMULARY MEDICATION Knee-high, medium compression, graduated compression stockings. Apply to lower extremities. 01/12/14   Monica Becton, MD  AMBULATORY NON FORMULARY MEDICATION Motorized scooter 04/10/14   Monica Becton, MD  AMBULATORY NON FORMULARY MEDICATION Transport Wheel Chair (size to be determined by weight and height) HT. 61 inches WT. 159 lbs Lumbar degenerative disc disease ICD - 10 M51.36  All other parts of the order unchanged. 05/02/14   Monica Becton, MD  apixaban (ELIQUIS) 2.5 MG TABS tablet Take 2.5 mg by mouth. 07/31/14   [provider]  aspirin EC 81 MG tablet Take 81 mg by mouth. Reported on 08/20/2015    [provider]  calcium carbonate (CALCIUM 600) 600 MG TABS tablet Take by mouth.  [provider]  carvedilol (COREG) 12.5 MG tablet Take 12.5 mg by mouth 2 (two) times daily with a meal.    [provider]  doxycycline (VIBRAMYCIN) 100 MG capsule Take 1 capsule (100 mg total) by mouth 2 (two) times daily. Take with food. 08/19/16   Lattie Haw, MD  gabapentin (NEURONTIN) 300 MG capsule Take 2 capsules (600 mg total) by mouth at bedtime. 06/23/16   Monica Becton, MD  levothyroxine (SYNTHROID, LEVOTHROID) 75 MCG tablet Take 75 mcg by mouth daily.    [provider]  lidocaine (XYLOCAINE)  5 % ointment Apply 1 application topically 3 (three) times daily as needed. Affected area on right thigh 05/24/14   Monica Becton, MD  lisinopril (PRINIVIL,ZESTRIL) 40 MG tablet Take 40 mg by mouth daily. Reported on 08/20/2015    [provider]  meloxicam (MOBIC) 15 MG tablet One tab PO qAM with breakfast for 2 weeks, then daily prn pain. 01/12/14   Monica Becton, MD  Multiple Vitamins-Minerals (CENTRUM SILVER PO) Take by mouth.    [provider]  nortriptyline (PAMELOR) 25 MG capsule Take 25 mg by mouth 2 (two) times daily.    [provider]  omeprazole (PRILOSEC) 40 MG capsule Take 40 mg by mouth daily.    [provider]  Potassium 99 MG TABS Take by mouth. Reported on 08/20/2015    [provider]  simvastatin (ZOCOR) 20 MG tablet Take 20 mg by mouth every evening.    [provider]  torsemide (DEMADEX) 20 MG tablet  07/06/15   [provider]    Family History History reviewed. No pertinent family history.  Social History Social History  Substance Use Topics  . Smoking status: Never Smoker  . Smokeless tobacco: Never Used  . Alcohol use No     Allergies   Codeine and Penicillins   Review of Systems Review of Systems  Constitutional: Negative for fatigue and fever.  Gastrointestinal: Negative for nausea.  Skin: Positive for color change.  All other systems reviewed and are negative.    Physical Exam Triage Vital Signs ED Triage Vitals  Enc Vitals Group     BP 08/19/16 1054 117/85     Pulse Rate 08/19/16 1054 90     Resp 08/19/16 1054 16     Temp 08/19/16 1054 98.4 F (36.9 C)     Temp Source 08/19/16 1054 Oral     SpO2 08/19/16 1054 92 %     Weight --      Height --      Head Circumference --      Peak Flow --      Pain Score 08/19/16 1055 3     Pain Loc --      Pain Edu? --      Excl. in GC? --    No data found.   Updated Vital Signs BP 117/85 (BP Location: Left Arm)    Pulse 90   Temp 98.4 F (36.9 C) (Oral)   Resp 16   SpO2 92%   Visual Acuity Right Eye Distance:   Left Eye Distance:   Bilateral Distance:    Right Eye Near:   Left Eye Near:    Bilateral Near:     Physical Exam  Constitutional: She appears well-developed and well-nourished. No distress.  HENT:  Head: Normocephalic.  Eyes: Pupils are equal, round, and reactive to light.  Neck: Neck supple.  Cardiovascular: Normal rate.   Pulmonary/Chest:  Effort normal.      Right upper back has large cystic lesion with two areas of fluctuance measuring total about 4cm by 8cm.  There is surrounding erythema and tenderness measuring about 10cm by 16cm.  Neurological: She is alert.  Skin: Skin is warm and dry.  Nursing note and vitals reviewed.    UC Treatments / Results  Labs (all labs ordered are listed, but only abnormal results are displayed) Labs Reviewed  WOUND CULTURE    EKG  EKG Interpretation None       Radiology No results found.  Procedures Procedures Incise and drain cyst/abscess Risks and benefits of procedure explained to patient and verbal consent obtained.  Using sterile technique, topical refrigerant spray, and local anesthesia with 1% lidocaine with epinephrine, cleansed affected area with Betadine and saline. Identified two fluctuant areas of lesion and incised each with #11 blade.  Probed both cavities to break up loculations.  Expressed blood and purulent material.  Inserted Iodoform gauze packing.  Bandage applied.  Patient tolerated well.   Medications Ordered in UC Medications - No data to display   Initial Impression / Assessment and Plan / UC Course  I have reviewed the triage vital signs and the nursing notes.  Pertinent labs & imaging results that were available during my care of the patient were reviewed by me and considered in my medical decision making (see chart for details).    Wound culture pending.  Begin doxycycline 100mg  BID for  staph coverage. Leave bandage in place until follow-up visit tomorrow.  Keep bandage clean and dry.  May take Tylenol as needed for pain.    Final Clinical Impressions(s) / UC Diagnoses   Final diagnoses:  Abscess  Infected sebaceous cyst    New Prescriptions New Prescriptions   DOXYCYCLINE (VIBRAMYCIN) 100 MG CAPSULE    Take 1 capsule (100 mg total) by mouth 2 (two) times daily. Take with food.     Lattie HawBeese, Carlise Stofer A, MD 08/19/16 [id number]

## 2016-08-19 NOTE — ED Triage Notes (Signed)
Pt c/o abscess on her RT upper back x 4 days. Denies fever.

## 2016-08-19 NOTE — Discharge Instructions (Signed)
Leave bandage in place until followup visit tomorrow.  Keep bandage clean and dry.  May take Tylenol as needed for pain. 

## 2016-08-20 ENCOUNTER — Emergency Department (INDEPENDENT_AMBULATORY_CARE_PROVIDER_SITE_OTHER)
Admission: EM | Admit: 2016-08-20 | Discharge: 2016-08-20 | Disposition: A | Discharge status: Home / Self Care | Payer: Medicare Other | Source: Home / Self Care | Attending: Family Medicine | Admitting: Family Medicine

## 2016-08-20 DIAGNOSIS — Z5189 Encounter for other specified aftercare: Secondary | ICD-10-CM

## 2016-08-20 DIAGNOSIS — Z4800 Encounter for change or removal of nonsurgical wound dressing: Secondary | ICD-10-CM

## 2016-08-20 NOTE — Discharge Instructions (Signed)
° °  Please keep taking the antibiotic as prescribed.  You may want to take the antibiotic 10-20 minutes after a meal or start taking probiotics or eating yogurt with probiotics to help with stomach upset.  Please return to this urgent care in 1-2 days for packing change and wound recheck.

## 2016-08-20 NOTE — ED Provider Notes (Signed)
Vitals:   08/20/16 1143  Pulse: 74  Temp: 97.6 F (36.4 C)   Jordie A Hileman is a 83 y.o. female presenting to UC with caregiver for evaluation and packing change from abscess that was I&D yesterday.  She is taking her Doxycycline as prescribed. Caregiver states the wound appears to be improving already as the redness has improved significantly. Pt notes pain has also improved. Denies fever or chills. Mild nausea with the antibiotic but no vomiting or diarrhea.  Wound appears to be healing well. Packing removed. New packing placed in both sites on Right upper back. Bandage applied. Encouraged to keep taking antibiotic as prescribed.  F/u in 2 days in UC for packing change/wound recheck.  Pt and caregiver agreeable with plan.   NO CHARGE. I&D was performed at this facility yesterday. Packing change part of 10 day I&D global charge.    Junius FinnerO'Malley, Audianna Landgren, PA-C 08/20/16 1158

## 2016-08-20 NOTE — ED Triage Notes (Signed)
Pt is here for a wound check on right shoulder

## 2016-08-22 ENCOUNTER — Encounter: Payer: Self-pay | Admitting: Emergency Medicine

## 2016-08-22 ENCOUNTER — Emergency Department (INDEPENDENT_AMBULATORY_CARE_PROVIDER_SITE_OTHER)
Admission: EM | Admit: 2016-08-22 | Discharge: 2016-08-22 | Disposition: A | Discharge status: Home / Self Care | Payer: Medicare Other | Source: Home / Self Care | Attending: Family Medicine | Admitting: Family Medicine

## 2016-08-22 DIAGNOSIS — Z5189 Encounter for other specified aftercare: Secondary | ICD-10-CM

## 2016-08-22 LAB — WOUND CULTURE
Gram Stain: NONE SEEN
ORGANISM ID, BACTERIA: NO GROWTH

## 2016-08-22 NOTE — ED Triage Notes (Signed)
Wound check right shoulder

## 2016-08-22 NOTE — Discharge Instructions (Signed)
Leave bandage in place until follow-up visit in two days.  Continue antibiotic.

## 2016-08-22 NOTE — ED Provider Notes (Signed)
[facility]    CSN: [id number] Arrival date & time: 08/22/16  1049     History   Chief Complaint Chief Complaint  Patient presents with  . Wound Check    HPI Mary Travis is a 83 y.o. female.   Patient returns for re-check of I and D site right upper back with no complaints.  She reports minimal pain.   The history is provided by the patient and a caregiver.    Past Medical History:  Diagnosis Date  . Atrial fibrillation (HCC)   . CHF (congestive heart failure) (HCC)   . Depression   . Diabetes 1.5, managed as type 2 (HCC)   . GERD (gastroesophageal reflux disease)   . Hyperlipidemia   . Hypertension   . Seasonal allergies   . Thyroid disease     Patient Active Problem List   Diagnosis Date Noted  . Fall 09/26/2015  . Head injury 09/26/2015  . Injury of right hand 09/26/2015  . Right leg injury 09/26/2015  . Left ankle pain 03/02/2015  . Chronic systolic heart failure (HCC) 07/06/2014  . TI (tricuspid incompetence) 05/09/2014  . Hypertensive pulmonary vascular disease (HCC) 05/09/2014  . MI (mitral incompetence) 05/09/2014  . Cardiomyopathy (HCC) 04/13/2014  . AF (paroxysmal atrial fibrillation) (HCC) 03/02/2014  . Lumbar degenerative disc disease 02/20/2014  . Trochanteric bursitis of right hip 01/31/2014  . Osteoarthritis of right hip 01/12/2014  . Venous stasis dermatitis 01/12/2014  . Episode of syncope 08/17/2013  . Closed fracture of ankle, medial malleolus 08/17/2013  . Closed fracture of metatarsal bone 08/17/2013  . Diabetes mellitus, type 2 (HCC) 12/21/2012  . Hypothyroidism 12/29/2009  . DM2 (diabetes mellitus, type 2) (HCC) 12/29/2009  . HYPERLIPIDEMIA 12/29/2009  . Essential hypertension 12/29/2009    Past Surgical History:  Procedure Laterality Date  . BACK SURGERY    . KNEE ARTHROCENTESIS      OB History    No data available       Home Medications    Prior to Admission medications   Medication Sig Start Date  End Date Taking? Authorizing Provider  AMBULATORY NON FORMULARY MEDICATION Knee-high, medium compression, graduated compression stockings. Apply to lower extremities. 01/12/14   Monica Becton, MD  AMBULATORY NON FORMULARY MEDICATION Motorized scooter 04/10/14   Monica Becton, MD  AMBULATORY NON FORMULARY MEDICATION Transport Wheel Chair (size to be determined by weight and height) HT. 61 inches WT. 159 lbs Lumbar degenerative disc disease ICD - 10 M51.36  All other parts of the order unchanged. 05/02/14   Monica Becton, MD  apixaban (ELIQUIS) 2.5 MG TABS tablet Take 2.5 mg by mouth. 07/31/14   [provider]  aspirin EC 81 MG tablet Take 81 mg by mouth. Reported on 08/20/2015    [provider]  calcium carbonate (CALCIUM 600) 600 MG TABS tablet Take by mouth.    [provider]  carvedilol (COREG) 12.5 MG tablet Take 12.5 mg by mouth 2 (two) times daily with a meal.    [provider]  doxycycline (VIBRAMYCIN) 100 MG capsule Take 1 capsule (100 mg total) by mouth 2 (two) times daily. Take with food. 08/19/16   Lattie Haw, MD  gabapentin (NEURONTIN) 300 MG capsule Take 2 capsules (600 mg total) by mouth at bedtime. 06/23/16   Monica Becton, MD  levothyroxine (SYNTHROID, LEVOTHROID) 75 MCG tablet Take 75 mcg by mouth daily.    [provider]  lidocaine (XYLOCAINE) 5 %  ointment Apply 1 application topically 3 (three) times daily as needed. Affected area on right thigh 05/24/14   Monica Bectonhekkekandam, Thomas J, MD  lisinopril (PRINIVIL,ZESTRIL) 40 MG tablet Take 40 mg by mouth daily. Reported on 08/20/2015    [provider]  meloxicam (MOBIC) 15 MG tablet One tab PO qAM with breakfast for 2 weeks, then daily prn pain. 01/12/14   Monica Bectonhekkekandam, Thomas J, MD  Multiple Vitamins-Minerals (CENTRUM SILVER PO) Take by mouth.    [provider]  nortriptyline (PAMELOR) 25 MG capsule Take 25 mg by mouth 2 (two) times  daily.    [provider]  omeprazole (PRILOSEC) 40 MG capsule Take 40 mg by mouth daily.    [provider]  Potassium 99 MG TABS Take by mouth. Reported on 08/20/2015    [provider]  simvastatin (ZOCOR) 20 MG tablet Take 20 mg by mouth every evening.    [provider]  torsemide (DEMADEX) 20 MG tablet  07/06/15   [provider]    Family History No family history on file.  Social History Social History  Substance Use Topics  . Smoking status: Never Smoker  . Smokeless tobacco: Never Used  . Alcohol use No     Allergies   Codeine and Penicillins   Review of Systems Review of Systems  Constitutional: Negative for chills, diaphoresis, fatigue and fever.  All other systems reviewed and are negative.    Physical Exam Triage Vital Signs ED Triage Vitals [08/22/16 1203]  Enc Vitals Group     BP (!) 141/85     Pulse Rate 69     Resp      Temp 97.5 F (36.4 C)     Temp Source Oral     SpO2 96 %     Weight      Height      Head Circumference      Peak Flow      Pain Score 2     Pain Loc      Pain Edu?      Excl. in GC?    No data found.   Updated Vital Signs BP (!) 141/85 (BP Location: Left Arm)   Pulse 69   Temp 97.5 F (36.4 C) (Oral)   SpO2 96%   Visual Acuity Right Eye Distance:   Left Eye Distance:   Bilateral Distance:    Right Eye Near:   Left Eye Near:    Bilateral Near:     Physical Exam  Constitutional: No distress.  Eyes: Pupils are equal, round, and reactive to light.  Cardiovascular: Normal rate.   Pulmonary/Chest: Effort normal.  Neurological: She is alert.  Skin: Skin is warm and dry.  I and D site right upper back has minimal erythema and no tenderness to palpation.  Minimal purulent drainage.  Smaller wound cavity now minimal depth.  Larger wound cavity decreased in size.  Nursing note and vitals reviewed.    UC Treatments / Results  Labs (all labs ordered are listed, but only  abnormal results are displayed) Labs Reviewed - No data to display  EKG  EKG Interpretation None       Radiology No results found.  Procedures Procedures (including critical care time)  Medications Ordered in UC Medications - No data to display   Initial Impression / Assessment and Plan / UC Course  I have reviewed the triage vital signs and the nursing notes.  Pertinent labs & imaging results that were available  during my care of the patient were reviewed by me and considered in my medical decision making (see chart for details).    Healing well. Placed small length of 1/4 inch packing in smaller wound cavity to maintain wound patency.  Placed slightly longer piece of packing in larger cavity to maintain wound patency.  Bandage applied. Leave bandage in place until follow-up visit in two days.  Continue antibiotic.    Final Clinical Impressions(s) / UC Diagnoses   Final diagnoses:  Visit for wound check    New Prescriptions New Prescriptions   No medications on file     Lattie Haw, MD 08/22/16 1234

## 2016-08-24 ENCOUNTER — Emergency Department (INDEPENDENT_AMBULATORY_CARE_PROVIDER_SITE_OTHER)
Admission: EM | Admit: 2016-08-24 | Discharge: 2016-08-24 | Disposition: A | Discharge status: Home / Self Care | Payer: Medicare Other | Source: Home / Self Care

## 2016-08-24 DIAGNOSIS — Z4800 Encounter for change or removal of nonsurgical wound dressing: Secondary | ICD-10-CM

## 2016-08-24 MED ORDER — DOXYCYCLINE HYCLATE 100 MG PO CAPS: 100.0000 mg | ORAL_CAPSULE | Freq: Two times a day (BID) | ORAL | 0 refills | Status: DC

## 2016-08-24 NOTE — ED Triage Notes (Signed)
Pt here to have the rest of her packing removed.

## 2016-08-24 NOTE — ED Provider Notes (Signed)
[facility]    CSN: [id number] Arrival date & time: 08/24/16  1115     History   Chief Complaint Chief Complaint  Patient presents with  . Follow-up    HPI Mary Travis is a 83 y.o. female.   Patient returns for dressing change and wound check.  She has no complaints, and denies pain at I and D site.   The history is provided by the patient.    Past Medical History:  Diagnosis Date  . Atrial fibrillation (HCC)   . CHF (congestive heart failure) (HCC)   . Depression   . Diabetes 1.5, managed as type 2 (HCC)   . GERD (gastroesophageal reflux disease)   . Hyperlipidemia   . Hypertension   . Seasonal allergies   . Thyroid disease     Patient Active Problem List   Diagnosis Date Noted  . Fall 09/26/2015  . Head injury 09/26/2015  . Injury of right hand 09/26/2015  . Right leg injury 09/26/2015  . Left ankle pain 03/02/2015  . Chronic systolic heart failure (HCC) 07/06/2014  . TI (tricuspid incompetence) 05/09/2014  . Hypertensive pulmonary vascular disease (HCC) 05/09/2014  . MI (mitral incompetence) 05/09/2014  . Cardiomyopathy (HCC) 04/13/2014  . AF (paroxysmal atrial fibrillation) (HCC) 03/02/2014  . Lumbar degenerative disc disease 02/20/2014  . Trochanteric bursitis of right hip 01/31/2014  . Osteoarthritis of right hip 01/12/2014  . Venous stasis dermatitis 01/12/2014  . Episode of syncope 08/17/2013  . Closed fracture of ankle, medial malleolus 08/17/2013  . Closed fracture of metatarsal bone 08/17/2013  . Diabetes mellitus, type 2 (HCC) 12/21/2012  . Hypothyroidism 12/29/2009  . DM2 (diabetes mellitus, type 2) (HCC) 12/29/2009  . HYPERLIPIDEMIA 12/29/2009  . Essential hypertension 12/29/2009    Past Surgical History:  Procedure Laterality Date  . BACK SURGERY    . KNEE ARTHROCENTESIS      OB History    No data available       Home Medications    Prior to Admission medications   Medication Sig Start Date End Date Taking?  Authorizing Provider  AMBULATORY NON FORMULARY MEDICATION Knee-high, medium compression, graduated compression stockings. Apply to lower extremities. 01/12/14   Monica Bectonhekkekandam, Thomas J, MD  AMBULATORY NON FORMULARY MEDICATION Motorized scooter 04/10/14   Monica Bectonhekkekandam, Thomas J, MD  AMBULATORY NON FORMULARY MEDICATION Transport Wheel Chair (size to be determined by weight and height) HT. 61 inches WT. 159 lbs Lumbar degenerative disc disease ICD - 10 M51.36  All other parts of the order unchanged. 05/02/14   Monica Bectonhekkekandam, Thomas J, MD  apixaban (ELIQUIS) 2.5 MG TABS tablet Take 2.5 mg by mouth. 07/31/14   [provider]  aspirin EC 81 MG tablet Take 81 mg by mouth. Reported on 08/20/2015    [provider]  calcium carbonate (CALCIUM 600) 600 MG TABS tablet Take by mouth.    [provider]  carvedilol (COREG) 12.5 MG tablet Take 12.5 mg by mouth 2 (two) times daily with a meal.    [provider]  doxycycline (VIBRAMYCIN) 100 MG capsule Take 1 capsule (100 mg total) by mouth 2 (two) times daily. Take with food. 08/24/16   Lattie HawBeese, Surena Welge A, MD  gabapentin (NEURONTIN) 300 MG capsule Take 2 capsules (600 mg total) by mouth at bedtime. 06/23/16   Monica Bectonhekkekandam, Thomas J, MD  levothyroxine (SYNTHROID, LEVOTHROID) 75 MCG tablet Take 75 mcg by mouth daily.    [provider]  lidocaine (XYLOCAINE) 5 % ointment Apply 1  application topically 3 (three) times daily as needed. Affected area on right thigh 05/24/14   Monica Becton, MD  lisinopril (PRINIVIL,ZESTRIL) 40 MG tablet Take 40 mg by mouth daily. Reported on 08/20/2015    [provider]  meloxicam (MOBIC) 15 MG tablet One tab PO qAM with breakfast for 2 weeks, then daily prn pain. 01/12/14   Monica Becton, MD  Multiple Vitamins-Minerals (CENTRUM SILVER PO) Take by mouth.    [provider]  nortriptyline (PAMELOR) 25 MG capsule Take 25 mg by mouth 2 (two) times daily.    [provider]  omeprazole (PRILOSEC) 40 MG capsule Take 40 mg by mouth daily.    [provider]  Potassium 99 MG TABS Take by mouth. Reported on 08/20/2015    [provider]  simvastatin (ZOCOR) 20 MG tablet Take 20 mg by mouth every evening.    [provider]  torsemide (DEMADEX) 20 MG tablet  07/06/15   [provider]    Family History No family history on file.  Social History Social History  Substance Use Topics  . Smoking status: Never Smoker  . Smokeless tobacco: Never Used  . Alcohol use No     Allergies   Codeine and Penicillins   Review of Systems Review of Systems  Constitutional: Negative for chills, diaphoresis and fever.  All other systems reviewed and are negative.    Physical Exam Triage Vital Signs ED Triage Vitals  Enc Vitals Group     BP 08/24/16 1207 100/61     Pulse Rate 08/24/16 1207 66     Resp --      Temp 08/24/16 1207 98 F (36.7 C)     Temp Source 08/24/16 1207 Oral     SpO2 08/24/16 1207 96 %     Weight --      Height --      Head Circumference --      Peak Flow --      Pain Score 08/24/16 1208 2     Pain Loc --      Pain Edu? --      Excl. in GC? --    No data found.   Updated Vital Signs BP 100/61 (BP Location: Right Arm)   Pulse 66   Temp 98 F (36.7 C) (Oral)   SpO2 96%   Visual Acuity Right Eye Distance:   Left Eye Distance:   Bilateral Distance:    Right Eye Near:   Left Eye Near:    Bilateral Near:     Physical Exam Nursing notes and Vital Signs reviewed. Appearance:  Patient appears stated age, and in no acute distress Right upper back I and D site:  Smaller wound shallow, about 5mm deep.  Larger wound slightly deeper.  No purulent drainage.  Minimal surrounding erythema and tenderness.  No further packing indicated.  Bandages applied.  UC Treatments / Results  Labs (all labs ordered are listed, but only abnormal results are displayed) Labs Reviewed - No data to  display  EKG  EKG Interpretation None       Radiology No results found.  Procedures Procedures (including critical care time)  Medications Ordered in UC Medications - No data to display   Initial Impression / Assessment and Plan / UC Course  I have reviewed the triage vital signs and the nursing notes.  Pertinent labs & imaging results that were available during my care of the patient were reviewed by me and  considered in my medical decision making (see chart for details).    Wounds healing well; no further packing indicated. Continue doxycycline for one more week. Change bandages daily until healed. Followup with Family Doctor if not improved in one week.     Final Clinical Impressions(s) / UC Diagnoses   Final diagnoses:  Dressing change    New Prescriptions Current Discharge Medication List       Lattie Haw, MD 08/24/16 1258

## 2016-08-24 NOTE — Discharge Instructions (Signed)
Change bandages daily until healed

## 2016-08-30 DIAGNOSIS — I5030 Unspecified diastolic (congestive) heart failure: Secondary | ICD-10-CM | POA: Diagnosis not present

## 2016-08-30 DIAGNOSIS — R0602 Shortness of breath: Secondary | ICD-10-CM | POA: Diagnosis not present

## 2016-09-29 DIAGNOSIS — R0602 Shortness of breath: Secondary | ICD-10-CM | POA: Diagnosis not present

## 2016-09-29 DIAGNOSIS — I5030 Unspecified diastolic (congestive) heart failure: Secondary | ICD-10-CM | POA: Diagnosis not present

## 2016-10-08 ENCOUNTER — Encounter: Payer: Self-pay | Admitting: Emergency Medicine

## 2016-10-08 ENCOUNTER — Emergency Department
Admission: EM | Admit: 2016-10-08 | Discharge: 2016-10-08 | Disposition: A | Discharge status: Home / Self Care | Payer: Medicare Other | Source: Home / Self Care | Attending: Family Medicine | Admitting: Family Medicine

## 2016-10-08 ENCOUNTER — Emergency Department (INDEPENDENT_AMBULATORY_CARE_PROVIDER_SITE_OTHER): Payer: Medicare Other

## 2016-10-08 DIAGNOSIS — I700 Atherosclerosis of aorta: Secondary | ICD-10-CM

## 2016-10-08 DIAGNOSIS — R0781 Pleurodynia: Secondary | ICD-10-CM | POA: Diagnosis not present

## 2016-10-08 DIAGNOSIS — S299XXA Unspecified injury of thorax, initial encounter: Secondary | ICD-10-CM | POA: Diagnosis not present

## 2016-10-08 NOTE — ED Triage Notes (Signed)
Fall 6 days ago hurting left ribs and breast.

## 2016-10-08 NOTE — ED Provider Notes (Signed)
[facility]    CSN: [id number] Arrival date & time: 10/08/16  1142     History   Chief Complaint Chief Complaint  Patient presents with  . Fall    HPI Mary Travis is a 83 y.o. female.   Patient reports that she lost her balance and fell at home 6 days ago, striking her left chest.  She denies loss of consciousness.  She complains of persistent pain in both her left and right lateral chest.  She denies shortness of breath.   The history is provided by the patient and a caregiver.  Fall  This is a new problem. Episode onset: 6 days ago. The problem occurs constantly. The problem has not changed since onset.Associated symptoms include chest pain. Pertinent negatives include no abdominal pain and no shortness of breath. The symptoms are aggravated by walking and coughing. Nothing relieves the symptoms. She has tried nothing for the symptoms.    Past Medical History:  Diagnosis Date  . Atrial fibrillation (HCC)   . CHF (congestive heart failure) (HCC)   . Depression   . Diabetes 1.5, managed as type 2 (HCC)   . GERD (gastroesophageal reflux disease)   . Hyperlipidemia   . Hypertension   . Seasonal allergies   . Thyroid disease     Patient Active Problem List   Diagnosis Date Noted  . Fall 09/26/2015  . Head injury 09/26/2015  . Injury of right hand 09/26/2015  . Right leg injury 09/26/2015  . Left ankle pain 03/02/2015  . Chronic systolic heart failure (HCC) 07/06/2014  . TI (tricuspid incompetence) 05/09/2014  . Hypertensive pulmonary vascular disease (HCC) 05/09/2014  . MI (mitral incompetence) 05/09/2014  . Cardiomyopathy (HCC) 04/13/2014  . AF (paroxysmal atrial fibrillation) (HCC) 03/02/2014  . Lumbar degenerative disc disease 02/20/2014  . Trochanteric bursitis of right hip 01/31/2014  . Osteoarthritis of right hip 01/12/2014  . Venous stasis dermatitis 01/12/2014  . Episode of syncope 08/17/2013  . Closed fracture of ankle, medial malleolus  08/17/2013  . Closed fracture of metatarsal bone 08/17/2013  . Diabetes mellitus, type 2 (HCC) 12/21/2012  . Hypothyroidism 12/29/2009  . DM2 (diabetes mellitus, type 2) (HCC) 12/29/2009  . HYPERLIPIDEMIA 12/29/2009  . Essential hypertension 12/29/2009    Past Surgical History:  Procedure Laterality Date  . BACK SURGERY    . KNEE ARTHROCENTESIS      OB History    No data available       Home Medications    Prior to Admission medications   Medication Sig Start Date End Date Taking? Authorizing Provider  AMBULATORY NON FORMULARY MEDICATION Knee-high, medium compression, graduated compression stockings. Apply to lower extremities. 01/12/14   Monica Bectonhekkekandam, Thomas J, MD  AMBULATORY NON FORMULARY MEDICATION Motorized scooter 04/10/14   Monica Bectonhekkekandam, Thomas J, MD  AMBULATORY NON FORMULARY MEDICATION Transport Wheel Chair (size to be determined by weight and height) HT. 61 inches WT. 159 lbs Lumbar degenerative disc disease ICD - 10 M51.36  All other parts of the order unchanged. 05/02/14   Monica Bectonhekkekandam, Thomas J, MD  apixaban (ELIQUIS) 2.5 MG TABS tablet Take 2.5 mg by mouth. 07/31/14   [provider]  aspirin EC 81 MG tablet Take 81 mg by mouth. Reported on 08/20/2015    [provider]  calcium carbonate (CALCIUM 600) 600 MG TABS tablet Take by mouth.    [provider]  carvedilol (COREG) 12.5 MG tablet Take 12.5 mg by mouth 2 (two) times daily with a meal.  [provider]  doxycycline (VIBRAMYCIN) 100 MG capsule Take 1 capsule (100 mg total) by mouth 2 (two) times daily. Take with food. 08/24/16   Lattie Haw, MD  gabapentin (NEURONTIN) 300 MG capsule Take 2 capsules (600 mg total) by mouth at bedtime. 06/23/16   Monica Becton, MD  levothyroxine (SYNTHROID, LEVOTHROID) 75 MCG tablet Take 75 mcg by mouth daily.    [provider]  lidocaine (XYLOCAINE) 5 % ointment Apply 1 application topically 3 (three) times daily as needed.  Affected area on right thigh 05/24/14   Monica Becton, MD  lisinopril (PRINIVIL,ZESTRIL) 40 MG tablet Take 40 mg by mouth daily. Reported on 08/20/2015    [provider]  meloxicam (MOBIC) 15 MG tablet One tab PO qAM with breakfast for 2 weeks, then daily prn pain. 01/12/14   Monica Becton, MD  Multiple Vitamins-Minerals (CENTRUM SILVER PO) Take by mouth.    [provider]  nortriptyline (PAMELOR) 25 MG capsule Take 25 mg by mouth 2 (two) times daily.    [provider]  omeprazole (PRILOSEC) 40 MG capsule Take 40 mg by mouth daily.    [provider]  Potassium 99 MG TABS Take by mouth. Reported on 08/20/2015    [provider]  simvastatin (ZOCOR) 20 MG tablet Take 20 mg by mouth every evening.    [provider]  torsemide (DEMADEX) 20 MG tablet  07/06/15   [provider]    Family History No family history on file.  Social History Social History  Substance Use Topics  . Smoking status: Never Smoker  . Smokeless tobacco: Never Used  . Alcohol use No     Allergies   Codeine and Penicillins   Review of Systems Review of Systems  Respiratory: Negative for cough, chest tightness, shortness of breath and wheezing.   Cardiovascular: Positive for chest pain.  Gastrointestinal: Negative for abdominal pain.  All other systems reviewed and are negative.    Physical Exam Triage Vital Signs ED Triage Vitals  Enc Vitals Group     BP 10/08/16 1202 121/65     Pulse Rate 10/08/16 1202 72     Resp --      Temp 10/08/16 1202 98.4 F (36.9 C)     Temp Source 10/08/16 1202 Oral     SpO2 10/08/16 1202 93 %     Weight 10/08/16 1203 165 lb (74.8 kg)     Height 10/08/16 1203 5\' 1"  (1.549 m)     Head Circumference --      Peak Flow --      Pain Score 10/08/16 1203 10     Pain Loc --      Pain Edu? --      Excl. in GC? --    No data found.   Updated Vital Signs BP 121/65 (BP Location: Left Arm)    Pulse 72   Temp 98.4 F (36.9 C) (Oral)   Ht 5\' 1"  (1.549 m)   Wt 165 lb (74.8 kg)   SpO2 93%   BMI 31.18 kg/m   Visual Acuity Right Eye Distance:   Left Eye Distance:   Bilateral Distance:    Right Eye Near:   Left Eye Near:    Bilateral Near:     Physical Exam  Constitutional: She appears well-developed and well-nourished. No distress.  HENT:  Head: Atraumatic.  Right Ear: External ear normal.  Left Ear: External ear normal.  Nose: Nose normal.  Eyes: Conjunctivae are normal. Pupils are equal, round, and reactive to light.  Neck: Normal range of motion. No muscular tenderness present. Normal range of motion present.  Cardiovascular: Normal heart sounds.   Pulmonary/Chest: Breath sounds normal.    There is bilateral anterior/lateral chest wall tenderness.  No tenderness to palpation within breast tissue.  No ecchymosis noted.  Abdominal: There is no tenderness.  Neurological: She is alert.  Skin: Skin is warm and dry.  Nursing note and vitals reviewed.    UC Treatments / Results  Labs (all labs ordered are listed, but only abnormal results are displayed) Labs Reviewed - No data to display  EKG  EKG Interpretation None       Radiology Dg Ribs Bilateral W/chest  Result Date: 10/08/2016 CLINICAL DATA:  Pain following recent fall EXAM: BILATERAL RIBS AND CHEST - 4+ VIEW COMPARISON:  Chest radiograph June 27, 2012 FINDINGS: Frontal chest as well as oblique and cone-down rib images bilaterally obtained. There is left base atelectasis. There is no lung edema or consolidation. Heart is upper normal in size with pulmonary vascularity within normal limits. There is aortic atherosclerosis. No adenopathy. Bones are osteoporotic. There is degenerative change in each shoulder, chronic and stable in appearance. There is no evident pneumothorax or pleural effusion. No appreciable rib fracture. IMPRESSION: No appreciable rib fracture. No pneumothorax evident. Bones  osteoporotic. Left base atelectasis. No edema or consolidation. There is aortic atherosclerosis. Aortic Atherosclerosis (ICD10-I70.0). Electronically Signed   By: Bretta Bang III M.D.   On: 10/08/2016 13:00    Procedures Procedures (including critical care time)  Medications Ordered in UC Medications - No data to display   Initial Impression / Assessment and Plan / UC Course  I have reviewed the triage vital signs and the nursing notes.  Pertinent labs & imaging results that were available during my care of the patient were reviewed by me and considered in my medical decision making (see chart for details).    May apply heating pad or ice pack as tolerated.  Try wearing a rib belt sparingly (stop using rib belt if cold or shortness of breath develops).  May take Tylenol if needed, for pain. If symptoms become significantly worse during the night or over the weekend, proceed to the local emergency room.     Final Clinical Impressions(s) / UC Diagnoses   Final diagnoses:  Rib pain on right side  Rib pain on left side    New Prescriptions New Prescriptions   No medications on file     Lattie Haw, MD 10/08/16 1325

## 2016-10-08 NOTE — Discharge Instructions (Signed)
May apply heating pad or ice pack as tolerated.  Try wearing a rib belt sparingly (stop using rib belt if cold or shortness of breath develops).  May take Tylenol if needed, for pain. If symptoms become significantly worse during the night or over the weekend, proceed to the local emergency room.

## 2016-10-30 DIAGNOSIS — I5030 Unspecified diastolic (congestive) heart failure: Secondary | ICD-10-CM | POA: Diagnosis not present

## 2016-10-30 DIAGNOSIS — R0602 Shortness of breath: Secondary | ICD-10-CM | POA: Diagnosis not present

## 2016-11-17 DIAGNOSIS — Z0000 Encounter for general adult medical examination without abnormal findings: Secondary | ICD-10-CM | POA: Diagnosis not present

## 2016-11-17 DIAGNOSIS — I10 Essential (primary) hypertension: Secondary | ICD-10-CM | POA: Diagnosis not present

## 2016-11-17 DIAGNOSIS — E039 Hypothyroidism, unspecified: Secondary | ICD-10-CM | POA: Diagnosis not present

## 2016-11-17 DIAGNOSIS — M810 Age-related osteoporosis without current pathological fracture: Secondary | ICD-10-CM | POA: Diagnosis not present

## 2016-11-17 DIAGNOSIS — R5383 Other fatigue: Secondary | ICD-10-CM | POA: Diagnosis not present

## 2016-11-17 DIAGNOSIS — E7800 Pure hypercholesterolemia, unspecified: Secondary | ICD-10-CM | POA: Diagnosis not present

## 2016-11-17 DIAGNOSIS — E1143 Type 2 diabetes mellitus with diabetic autonomic (poly)neuropathy: Secondary | ICD-10-CM | POA: Diagnosis not present

## 2016-11-17 DIAGNOSIS — I4891 Unspecified atrial fibrillation: Secondary | ICD-10-CM | POA: Diagnosis not present

## 2016-11-30 DIAGNOSIS — I5030 Unspecified diastolic (congestive) heart failure: Secondary | ICD-10-CM | POA: Diagnosis not present

## 2016-11-30 DIAGNOSIS — R0602 Shortness of breath: Secondary | ICD-10-CM | POA: Diagnosis not present

## 2016-12-19 DIAGNOSIS — Z23 Encounter for immunization: Secondary | ICD-10-CM | POA: Diagnosis not present

## 2016-12-30 DIAGNOSIS — R0602 Shortness of breath: Secondary | ICD-10-CM | POA: Diagnosis not present

## 2016-12-30 DIAGNOSIS — I5030 Unspecified diastolic (congestive) heart failure: Secondary | ICD-10-CM | POA: Diagnosis not present

## 2016-12-31 DIAGNOSIS — R350 Frequency of micturition: Secondary | ICD-10-CM | POA: Diagnosis not present

## 2016-12-31 DIAGNOSIS — R300 Dysuria: Secondary | ICD-10-CM | POA: Diagnosis not present

## 2016-12-31 DIAGNOSIS — N3000 Acute cystitis without hematuria: Secondary | ICD-10-CM | POA: Diagnosis not present

## 2017-01-05 DIAGNOSIS — M810 Age-related osteoporosis without current pathological fracture: Secondary | ICD-10-CM | POA: Diagnosis not present

## 2017-01-05 DIAGNOSIS — Z1231 Encounter for screening mammogram for malignant neoplasm of breast: Secondary | ICD-10-CM | POA: Diagnosis not present

## 2017-01-05 DIAGNOSIS — Z780 Asymptomatic menopausal state: Secondary | ICD-10-CM | POA: Diagnosis not present

## 2017-01-14 DIAGNOSIS — I10 Essential (primary) hypertension: Secondary | ICD-10-CM | POA: Diagnosis not present

## 2017-01-14 DIAGNOSIS — I5032 Chronic diastolic (congestive) heart failure: Secondary | ICD-10-CM | POA: Diagnosis not present

## 2017-01-14 DIAGNOSIS — Z8673 Personal history of transient ischemic attack (TIA), and cerebral infarction without residual deficits: Secondary | ICD-10-CM | POA: Diagnosis not present

## 2017-01-14 DIAGNOSIS — I482 Chronic atrial fibrillation: Secondary | ICD-10-CM | POA: Diagnosis not present

## 2017-01-16 ENCOUNTER — Ambulatory Visit (INDEPENDENT_AMBULATORY_CARE_PROVIDER_SITE_OTHER): Payer: Medicare Other | Admitting: Sports Medicine

## 2017-01-16 ENCOUNTER — Encounter: Payer: Self-pay | Admitting: Sports Medicine

## 2017-01-16 DIAGNOSIS — M7062 Trochanteric bursitis, left hip: Secondary | ICD-10-CM | POA: Diagnosis not present

## 2017-01-16 DIAGNOSIS — M7061 Trochanteric bursitis, right hip: Secondary | ICD-10-CM

## 2017-01-16 NOTE — Progress Notes (Signed)
Subjective:    I'm seeing this patient as a consultation for: Dr. Harl Bowieathy Judge  CC: Bilateral hip pain  HPI: This is a pleasant 83 year old female, I have seen her 3 years ago for trochanteric bursitis, right.  This was injected and she did well for 3 years.  Now having a recurrence of pain, bilateral, lateral hips, moderate, persistent, localized without radiation, no trauma.  Past medical history, Surgical history, Family history not pertinant except as noted below, Social history, Allergies, and medications have been entered into the medical record, reviewed, and no changes needed.   Review of Systems: No headache, visual changes, nausea, vomiting, diarrhea, constipation, dizziness, abdominal pain, skin rash, fevers, chills, night sweats, weight loss, swollen lymph nodes, body aches, joint swelling, muscle aches, chest pain, shortness of breath, mood changes, visual or auditory hallucinations.   Objective:   General: Well Developed, well nourished, and in no acute distress.  Neuro:  Extra-ocular muscles intact, able to move all 4 extremities, sensation grossly intact.  Deep tendon reflexes tested were normal. Psych: Alert and oriented, mood congruent with affect. ENT:  Ears and nose appear unremarkable.  Hearing grossly normal. Neck: Unremarkable overall appearance, trachea midline.  No visible thyroid enlargement. Eyes: Conjunctivae and lids appear unremarkable.  Pupils equal and round. Skin: Warm and dry, no rashes noted.  Cardiovascular: Pulses palpable, no extremity edema. Bilateral hips: ROM IR: 60 Deg, ER: 60 Deg, Flexion: 120 Deg, Extension: 100 Deg, Abduction: 45 Deg, Adduction: 45 Deg Strength IR: 5/5, ER: 5/5, Flexion: 5/5, Extension: 5/5, Abduction: 5/5, Adduction: 5/5 Pelvic alignment unremarkable to inspection and palpation. Standing hip rotation and gait without trendelenburg / unsteadiness. Left and right greater trochanteric bursae with tenderness to palpation No  tenderness over piriformis. No SI joint tenderness and normal minimal SI movement.  Procedure: Real-time Ultrasound Guided Injection of left greater trochanteric bursa Device: GE Logiq E  Verbal informed consent obtained.  Time-out conducted.  Noted no overlying erythema, induration, or other signs of local infection.  Skin prepped in a sterile fashion.  Local anesthesia: Topical Ethyl chloride.  With sterile technique and under real time ultrasound guidance: 1 cc kenalog 40, 2 cc lidocaine, 2 cc bupivacaine injected easily Completed without difficulty  Pain immediately resolved suggesting accurate placement of the medication.  Advised to call if fevers/chills, erythema, induration, drainage, or persistent bleeding.  Images permanently stored and available for review in the ultrasound unit.  Impression: Technically successful ultrasound guided injection.  Procedure: Real-time Ultrasound Guided Injection of right greater trochanteric bursa Device: GE Logiq E  Verbal informed consent obtained.  Time-out conducted.  Noted no overlying erythema, induration, or other signs of local infection.  Skin prepped in a sterile fashion.  Local anesthesia: Topical Ethyl chloride.  With sterile technique and under real time ultrasound guidance: 1 cc kenalog 40, 2 cc lidocaine, 2 cc bupivacaine injected easily Completed without difficulty  Pain immediately resolved suggesting accurate placement of the medication.  Advised to call if fevers/chills, erythema, induration, drainage, or persistent bleeding.  Images permanently stored and available for review in the ultrasound unit.  Impression: Technically successful ultrasound guided injection.  Impression and Recommendations:   This case required medical decision making of moderate complexity.  Trochanteric bursitis of both hips Previous trochanteric bursa injection on the right lasted 3 years. Injections, this time bilateral, I am also going to set  her up with home health physical therapy. I have asked her to discuss her osteoporosis with her primary care provider.  ___________________________________________ Gwen Her. Dianah Field, M.D., ABFM., CAQSM. Primary Care and [place] Instructor of [place] [employer] [facility]

## 2017-01-16 NOTE — Assessment & Plan Note (Signed)
Previous trochanteric bursa injection on the right lasted 3 years. Injections, this time bilateral, I am also going to set her up with home health physical therapy. I have asked her to discuss her osteoporosis with her primary care provider.

## 2017-01-17 DIAGNOSIS — R51 Headache: Secondary | ICD-10-CM | POA: Diagnosis not present

## 2017-01-17 DIAGNOSIS — R9082 White matter disease, unspecified: Secondary | ICD-10-CM | POA: Diagnosis not present

## 2017-01-17 DIAGNOSIS — Z7901 Long term (current) use of anticoagulants: Secondary | ICD-10-CM | POA: Diagnosis not present

## 2017-01-17 DIAGNOSIS — E119 Type 2 diabetes mellitus without complications: Secondary | ICD-10-CM | POA: Diagnosis not present

## 2017-01-17 DIAGNOSIS — Z79899 Other long term (current) drug therapy: Secondary | ICD-10-CM | POA: Diagnosis not present

## 2017-01-17 DIAGNOSIS — I4891 Unspecified atrial fibrillation: Secondary | ICD-10-CM | POA: Diagnosis not present

## 2017-01-17 DIAGNOSIS — K219 Gastro-esophageal reflux disease without esophagitis: Secondary | ICD-10-CM | POA: Diagnosis not present

## 2017-01-17 DIAGNOSIS — Z880 Allergy status to penicillin: Secondary | ICD-10-CM | POA: Diagnosis not present

## 2017-01-17 DIAGNOSIS — Z7984 Long term (current) use of oral hypoglycemic drugs: Secondary | ICD-10-CM | POA: Diagnosis not present

## 2017-01-17 DIAGNOSIS — I672 Cerebral atherosclerosis: Secondary | ICD-10-CM | POA: Diagnosis not present

## 2017-01-17 DIAGNOSIS — Z888 Allergy status to other drugs, medicaments and biological substances status: Secondary | ICD-10-CM | POA: Diagnosis not present

## 2017-01-17 DIAGNOSIS — E039 Hypothyroidism, unspecified: Secondary | ICD-10-CM | POA: Diagnosis not present

## 2017-01-17 DIAGNOSIS — I10 Essential (primary) hypertension: Secondary | ICD-10-CM | POA: Diagnosis not present

## 2017-01-17 DIAGNOSIS — Z96651 Presence of right artificial knee joint: Secondary | ICD-10-CM | POA: Diagnosis not present

## 2017-01-17 DIAGNOSIS — Z791 Long term (current) use of non-steroidal anti-inflammatories (NSAID): Secondary | ICD-10-CM | POA: Diagnosis not present

## 2017-01-17 DIAGNOSIS — I63521 Cerebral infarction due to unspecified occlusion or stenosis of right anterior cerebral artery: Secondary | ICD-10-CM | POA: Diagnosis not present

## 2017-01-17 DIAGNOSIS — Z882 Allergy status to sulfonamides status: Secondary | ICD-10-CM | POA: Diagnosis not present

## 2017-01-17 DIAGNOSIS — Z885 Allergy status to narcotic agent status: Secondary | ICD-10-CM | POA: Diagnosis not present

## 2017-01-21 DIAGNOSIS — Z7984 Long term (current) use of oral hypoglycemic drugs: Secondary | ICD-10-CM | POA: Diagnosis not present

## 2017-01-21 DIAGNOSIS — E039 Hypothyroidism, unspecified: Secondary | ICD-10-CM | POA: Diagnosis not present

## 2017-01-21 DIAGNOSIS — I110 Hypertensive heart disease with heart failure: Secondary | ICD-10-CM | POA: Diagnosis not present

## 2017-01-21 DIAGNOSIS — E119 Type 2 diabetes mellitus without complications: Secondary | ICD-10-CM | POA: Diagnosis not present

## 2017-01-21 DIAGNOSIS — Z9181 History of falling: Secondary | ICD-10-CM | POA: Diagnosis not present

## 2017-01-21 DIAGNOSIS — Z7901 Long term (current) use of anticoagulants: Secondary | ICD-10-CM | POA: Diagnosis not present

## 2017-01-21 DIAGNOSIS — M5136 Other intervertebral disc degeneration, lumbar region: Secondary | ICD-10-CM | POA: Diagnosis not present

## 2017-01-21 DIAGNOSIS — M6281 Muscle weakness (generalized): Secondary | ICD-10-CM | POA: Diagnosis not present

## 2017-01-21 DIAGNOSIS — M7062 Trochanteric bursitis, left hip: Secondary | ICD-10-CM | POA: Diagnosis not present

## 2017-01-21 DIAGNOSIS — I4891 Unspecified atrial fibrillation: Secondary | ICD-10-CM | POA: Diagnosis not present

## 2017-01-21 DIAGNOSIS — M7061 Trochanteric bursitis, right hip: Secondary | ICD-10-CM | POA: Diagnosis not present

## 2017-01-21 DIAGNOSIS — I5022 Chronic systolic (congestive) heart failure: Secondary | ICD-10-CM | POA: Diagnosis not present

## 2017-01-21 DIAGNOSIS — Z9981 Dependence on supplemental oxygen: Secondary | ICD-10-CM | POA: Diagnosis not present

## 2017-01-22 DIAGNOSIS — I5022 Chronic systolic (congestive) heart failure: Secondary | ICD-10-CM | POA: Diagnosis not present

## 2017-01-22 DIAGNOSIS — Z7984 Long term (current) use of oral hypoglycemic drugs: Secondary | ICD-10-CM | POA: Diagnosis not present

## 2017-01-22 DIAGNOSIS — Z9981 Dependence on supplemental oxygen: Secondary | ICD-10-CM | POA: Diagnosis not present

## 2017-01-22 DIAGNOSIS — Z9181 History of falling: Secondary | ICD-10-CM | POA: Diagnosis not present

## 2017-01-22 DIAGNOSIS — M7061 Trochanteric bursitis, right hip: Secondary | ICD-10-CM | POA: Diagnosis not present

## 2017-01-22 DIAGNOSIS — M6281 Muscle weakness (generalized): Secondary | ICD-10-CM | POA: Diagnosis not present

## 2017-01-22 DIAGNOSIS — M7062 Trochanteric bursitis, left hip: Secondary | ICD-10-CM | POA: Diagnosis not present

## 2017-01-22 DIAGNOSIS — E119 Type 2 diabetes mellitus without complications: Secondary | ICD-10-CM | POA: Diagnosis not present

## 2017-01-22 DIAGNOSIS — I4891 Unspecified atrial fibrillation: Secondary | ICD-10-CM | POA: Diagnosis not present

## 2017-01-22 DIAGNOSIS — E039 Hypothyroidism, unspecified: Secondary | ICD-10-CM | POA: Diagnosis not present

## 2017-01-22 DIAGNOSIS — I110 Hypertensive heart disease with heart failure: Secondary | ICD-10-CM | POA: Diagnosis not present

## 2017-01-22 DIAGNOSIS — Z7901 Long term (current) use of anticoagulants: Secondary | ICD-10-CM | POA: Diagnosis not present

## 2017-01-22 DIAGNOSIS — M5136 Other intervertebral disc degeneration, lumbar region: Secondary | ICD-10-CM | POA: Diagnosis not present

## 2017-01-26 DIAGNOSIS — I5022 Chronic systolic (congestive) heart failure: Secondary | ICD-10-CM | POA: Diagnosis not present

## 2017-01-26 DIAGNOSIS — E039 Hypothyroidism, unspecified: Secondary | ICD-10-CM | POA: Diagnosis not present

## 2017-01-26 DIAGNOSIS — Z7984 Long term (current) use of oral hypoglycemic drugs: Secondary | ICD-10-CM | POA: Diagnosis not present

## 2017-01-26 DIAGNOSIS — E119 Type 2 diabetes mellitus without complications: Secondary | ICD-10-CM | POA: Diagnosis not present

## 2017-01-26 DIAGNOSIS — M5136 Other intervertebral disc degeneration, lumbar region: Secondary | ICD-10-CM | POA: Diagnosis not present

## 2017-01-26 DIAGNOSIS — M6281 Muscle weakness (generalized): Secondary | ICD-10-CM | POA: Diagnosis not present

## 2017-01-26 DIAGNOSIS — M7061 Trochanteric bursitis, right hip: Secondary | ICD-10-CM | POA: Diagnosis not present

## 2017-01-26 DIAGNOSIS — M7062 Trochanteric bursitis, left hip: Secondary | ICD-10-CM | POA: Diagnosis not present

## 2017-01-26 DIAGNOSIS — I4891 Unspecified atrial fibrillation: Secondary | ICD-10-CM | POA: Diagnosis not present

## 2017-01-26 DIAGNOSIS — I110 Hypertensive heart disease with heart failure: Secondary | ICD-10-CM | POA: Diagnosis not present

## 2017-01-26 DIAGNOSIS — Z7901 Long term (current) use of anticoagulants: Secondary | ICD-10-CM | POA: Diagnosis not present

## 2017-01-26 DIAGNOSIS — Z9981 Dependence on supplemental oxygen: Secondary | ICD-10-CM | POA: Diagnosis not present

## 2017-01-26 DIAGNOSIS — Z9181 History of falling: Secondary | ICD-10-CM | POA: Diagnosis not present

## 2017-01-27 DIAGNOSIS — M7062 Trochanteric bursitis, left hip: Secondary | ICD-10-CM | POA: Diagnosis not present

## 2017-01-27 DIAGNOSIS — Z7901 Long term (current) use of anticoagulants: Secondary | ICD-10-CM | POA: Diagnosis not present

## 2017-01-27 DIAGNOSIS — M5136 Other intervertebral disc degeneration, lumbar region: Secondary | ICD-10-CM | POA: Diagnosis not present

## 2017-01-27 DIAGNOSIS — M6281 Muscle weakness (generalized): Secondary | ICD-10-CM | POA: Diagnosis not present

## 2017-01-27 DIAGNOSIS — E119 Type 2 diabetes mellitus without complications: Secondary | ICD-10-CM | POA: Diagnosis not present

## 2017-01-27 DIAGNOSIS — I110 Hypertensive heart disease with heart failure: Secondary | ICD-10-CM | POA: Diagnosis not present

## 2017-01-27 DIAGNOSIS — I5022 Chronic systolic (congestive) heart failure: Secondary | ICD-10-CM | POA: Diagnosis not present

## 2017-01-27 DIAGNOSIS — E039 Hypothyroidism, unspecified: Secondary | ICD-10-CM | POA: Diagnosis not present

## 2017-01-27 DIAGNOSIS — Z9981 Dependence on supplemental oxygen: Secondary | ICD-10-CM | POA: Diagnosis not present

## 2017-01-27 DIAGNOSIS — Z9181 History of falling: Secondary | ICD-10-CM | POA: Diagnosis not present

## 2017-01-27 DIAGNOSIS — I4891 Unspecified atrial fibrillation: Secondary | ICD-10-CM | POA: Diagnosis not present

## 2017-01-27 DIAGNOSIS — Z7984 Long term (current) use of oral hypoglycemic drugs: Secondary | ICD-10-CM | POA: Diagnosis not present

## 2017-01-27 DIAGNOSIS — M7061 Trochanteric bursitis, right hip: Secondary | ICD-10-CM | POA: Diagnosis not present

## 2017-01-29 DIAGNOSIS — I4891 Unspecified atrial fibrillation: Secondary | ICD-10-CM | POA: Diagnosis not present

## 2017-01-29 DIAGNOSIS — E039 Hypothyroidism, unspecified: Secondary | ICD-10-CM | POA: Diagnosis not present

## 2017-01-29 DIAGNOSIS — Z7984 Long term (current) use of oral hypoglycemic drugs: Secondary | ICD-10-CM | POA: Diagnosis not present

## 2017-01-29 DIAGNOSIS — I110 Hypertensive heart disease with heart failure: Secondary | ICD-10-CM | POA: Diagnosis not present

## 2017-01-29 DIAGNOSIS — I5022 Chronic systolic (congestive) heart failure: Secondary | ICD-10-CM | POA: Diagnosis not present

## 2017-01-29 DIAGNOSIS — M7061 Trochanteric bursitis, right hip: Secondary | ICD-10-CM | POA: Diagnosis not present

## 2017-01-29 DIAGNOSIS — M7062 Trochanteric bursitis, left hip: Secondary | ICD-10-CM | POA: Diagnosis not present

## 2017-01-29 DIAGNOSIS — M6281 Muscle weakness (generalized): Secondary | ICD-10-CM | POA: Diagnosis not present

## 2017-01-29 DIAGNOSIS — Z7901 Long term (current) use of anticoagulants: Secondary | ICD-10-CM | POA: Diagnosis not present

## 2017-01-29 DIAGNOSIS — E119 Type 2 diabetes mellitus without complications: Secondary | ICD-10-CM | POA: Diagnosis not present

## 2017-01-29 DIAGNOSIS — Z9981 Dependence on supplemental oxygen: Secondary | ICD-10-CM | POA: Diagnosis not present

## 2017-01-29 DIAGNOSIS — Z9181 History of falling: Secondary | ICD-10-CM | POA: Diagnosis not present

## 2017-01-29 DIAGNOSIS — M5136 Other intervertebral disc degeneration, lumbar region: Secondary | ICD-10-CM | POA: Diagnosis not present

## 2017-01-30 DIAGNOSIS — I5030 Unspecified diastolic (congestive) heart failure: Secondary | ICD-10-CM | POA: Diagnosis not present

## 2017-01-30 DIAGNOSIS — R0602 Shortness of breath: Secondary | ICD-10-CM | POA: Diagnosis not present

## 2017-02-02 DIAGNOSIS — Z9981 Dependence on supplemental oxygen: Secondary | ICD-10-CM | POA: Diagnosis not present

## 2017-02-02 DIAGNOSIS — I110 Hypertensive heart disease with heart failure: Secondary | ICD-10-CM | POA: Diagnosis not present

## 2017-02-02 DIAGNOSIS — M7062 Trochanteric bursitis, left hip: Secondary | ICD-10-CM | POA: Diagnosis not present

## 2017-02-02 DIAGNOSIS — Z7984 Long term (current) use of oral hypoglycemic drugs: Secondary | ICD-10-CM | POA: Diagnosis not present

## 2017-02-02 DIAGNOSIS — Z7901 Long term (current) use of anticoagulants: Secondary | ICD-10-CM | POA: Diagnosis not present

## 2017-02-02 DIAGNOSIS — I4891 Unspecified atrial fibrillation: Secondary | ICD-10-CM | POA: Diagnosis not present

## 2017-02-02 DIAGNOSIS — M5136 Other intervertebral disc degeneration, lumbar region: Secondary | ICD-10-CM | POA: Diagnosis not present

## 2017-02-02 DIAGNOSIS — E119 Type 2 diabetes mellitus without complications: Secondary | ICD-10-CM | POA: Diagnosis not present

## 2017-02-02 DIAGNOSIS — Z9181 History of falling: Secondary | ICD-10-CM | POA: Diagnosis not present

## 2017-02-02 DIAGNOSIS — E039 Hypothyroidism, unspecified: Secondary | ICD-10-CM | POA: Diagnosis not present

## 2017-02-02 DIAGNOSIS — I5022 Chronic systolic (congestive) heart failure: Secondary | ICD-10-CM | POA: Diagnosis not present

## 2017-02-02 DIAGNOSIS — M7061 Trochanteric bursitis, right hip: Secondary | ICD-10-CM | POA: Diagnosis not present

## 2017-02-02 DIAGNOSIS — M6281 Muscle weakness (generalized): Secondary | ICD-10-CM | POA: Diagnosis not present

## 2017-02-04 DIAGNOSIS — I5022 Chronic systolic (congestive) heart failure: Secondary | ICD-10-CM | POA: Diagnosis not present

## 2017-02-04 DIAGNOSIS — M7061 Trochanteric bursitis, right hip: Secondary | ICD-10-CM | POA: Diagnosis not present

## 2017-02-04 DIAGNOSIS — Z7901 Long term (current) use of anticoagulants: Secondary | ICD-10-CM | POA: Diagnosis not present

## 2017-02-04 DIAGNOSIS — Z9181 History of falling: Secondary | ICD-10-CM | POA: Diagnosis not present

## 2017-02-04 DIAGNOSIS — I110 Hypertensive heart disease with heart failure: Secondary | ICD-10-CM | POA: Diagnosis not present

## 2017-02-04 DIAGNOSIS — M7062 Trochanteric bursitis, left hip: Secondary | ICD-10-CM | POA: Diagnosis not present

## 2017-02-04 DIAGNOSIS — I4891 Unspecified atrial fibrillation: Secondary | ICD-10-CM | POA: Diagnosis not present

## 2017-02-04 DIAGNOSIS — M5136 Other intervertebral disc degeneration, lumbar region: Secondary | ICD-10-CM | POA: Diagnosis not present

## 2017-02-04 DIAGNOSIS — Z9981 Dependence on supplemental oxygen: Secondary | ICD-10-CM | POA: Diagnosis not present

## 2017-02-04 DIAGNOSIS — E039 Hypothyroidism, unspecified: Secondary | ICD-10-CM | POA: Diagnosis not present

## 2017-02-04 DIAGNOSIS — E119 Type 2 diabetes mellitus without complications: Secondary | ICD-10-CM | POA: Diagnosis not present

## 2017-02-04 DIAGNOSIS — M6281 Muscle weakness (generalized): Secondary | ICD-10-CM | POA: Diagnosis not present

## 2017-02-04 DIAGNOSIS — Z7984 Long term (current) use of oral hypoglycemic drugs: Secondary | ICD-10-CM | POA: Diagnosis not present

## 2017-02-05 DIAGNOSIS — M7062 Trochanteric bursitis, left hip: Secondary | ICD-10-CM | POA: Diagnosis not present

## 2017-02-05 DIAGNOSIS — M6281 Muscle weakness (generalized): Secondary | ICD-10-CM | POA: Diagnosis not present

## 2017-02-05 DIAGNOSIS — I4891 Unspecified atrial fibrillation: Secondary | ICD-10-CM | POA: Diagnosis not present

## 2017-02-05 DIAGNOSIS — Z9981 Dependence on supplemental oxygen: Secondary | ICD-10-CM | POA: Diagnosis not present

## 2017-02-05 DIAGNOSIS — Z7984 Long term (current) use of oral hypoglycemic drugs: Secondary | ICD-10-CM | POA: Diagnosis not present

## 2017-02-05 DIAGNOSIS — Z9181 History of falling: Secondary | ICD-10-CM | POA: Diagnosis not present

## 2017-02-05 DIAGNOSIS — M5136 Other intervertebral disc degeneration, lumbar region: Secondary | ICD-10-CM | POA: Diagnosis not present

## 2017-02-05 DIAGNOSIS — E039 Hypothyroidism, unspecified: Secondary | ICD-10-CM | POA: Diagnosis not present

## 2017-02-05 DIAGNOSIS — Z7901 Long term (current) use of anticoagulants: Secondary | ICD-10-CM | POA: Diagnosis not present

## 2017-02-05 DIAGNOSIS — M7061 Trochanteric bursitis, right hip: Secondary | ICD-10-CM | POA: Diagnosis not present

## 2017-02-05 DIAGNOSIS — I110 Hypertensive heart disease with heart failure: Secondary | ICD-10-CM | POA: Diagnosis not present

## 2017-02-05 DIAGNOSIS — I5022 Chronic systolic (congestive) heart failure: Secondary | ICD-10-CM | POA: Diagnosis not present

## 2017-02-05 DIAGNOSIS — E119 Type 2 diabetes mellitus without complications: Secondary | ICD-10-CM | POA: Diagnosis not present

## 2017-02-09 DIAGNOSIS — Z7901 Long term (current) use of anticoagulants: Secondary | ICD-10-CM | POA: Diagnosis not present

## 2017-02-09 DIAGNOSIS — M6281 Muscle weakness (generalized): Secondary | ICD-10-CM | POA: Diagnosis not present

## 2017-02-09 DIAGNOSIS — E119 Type 2 diabetes mellitus without complications: Secondary | ICD-10-CM | POA: Diagnosis not present

## 2017-02-09 DIAGNOSIS — I4891 Unspecified atrial fibrillation: Secondary | ICD-10-CM | POA: Diagnosis not present

## 2017-02-09 DIAGNOSIS — M5136 Other intervertebral disc degeneration, lumbar region: Secondary | ICD-10-CM | POA: Diagnosis not present

## 2017-02-09 DIAGNOSIS — E039 Hypothyroidism, unspecified: Secondary | ICD-10-CM | POA: Diagnosis not present

## 2017-02-09 DIAGNOSIS — I110 Hypertensive heart disease with heart failure: Secondary | ICD-10-CM | POA: Diagnosis not present

## 2017-02-09 DIAGNOSIS — I5022 Chronic systolic (congestive) heart failure: Secondary | ICD-10-CM | POA: Diagnosis not present

## 2017-02-09 DIAGNOSIS — Z7984 Long term (current) use of oral hypoglycemic drugs: Secondary | ICD-10-CM | POA: Diagnosis not present

## 2017-02-09 DIAGNOSIS — M7062 Trochanteric bursitis, left hip: Secondary | ICD-10-CM | POA: Diagnosis not present

## 2017-02-09 DIAGNOSIS — Z9981 Dependence on supplemental oxygen: Secondary | ICD-10-CM | POA: Diagnosis not present

## 2017-02-09 DIAGNOSIS — M7061 Trochanteric bursitis, right hip: Secondary | ICD-10-CM | POA: Diagnosis not present

## 2017-02-09 DIAGNOSIS — Z9181 History of falling: Secondary | ICD-10-CM | POA: Diagnosis not present

## 2017-02-10 DIAGNOSIS — R350 Frequency of micturition: Secondary | ICD-10-CM | POA: Diagnosis not present

## 2017-02-10 DIAGNOSIS — R300 Dysuria: Secondary | ICD-10-CM | POA: Diagnosis not present

## 2017-02-10 DIAGNOSIS — R3915 Urgency of urination: Secondary | ICD-10-CM | POA: Diagnosis not present

## 2017-02-11 DIAGNOSIS — E039 Hypothyroidism, unspecified: Secondary | ICD-10-CM | POA: Diagnosis not present

## 2017-02-11 DIAGNOSIS — I110 Hypertensive heart disease with heart failure: Secondary | ICD-10-CM | POA: Diagnosis not present

## 2017-02-11 DIAGNOSIS — M7062 Trochanteric bursitis, left hip: Secondary | ICD-10-CM | POA: Diagnosis not present

## 2017-02-11 DIAGNOSIS — Z7984 Long term (current) use of oral hypoglycemic drugs: Secondary | ICD-10-CM | POA: Diagnosis not present

## 2017-02-11 DIAGNOSIS — M5136 Other intervertebral disc degeneration, lumbar region: Secondary | ICD-10-CM | POA: Diagnosis not present

## 2017-02-11 DIAGNOSIS — E119 Type 2 diabetes mellitus without complications: Secondary | ICD-10-CM | POA: Diagnosis not present

## 2017-02-11 DIAGNOSIS — M6281 Muscle weakness (generalized): Secondary | ICD-10-CM | POA: Diagnosis not present

## 2017-02-11 DIAGNOSIS — Z7901 Long term (current) use of anticoagulants: Secondary | ICD-10-CM | POA: Diagnosis not present

## 2017-02-11 DIAGNOSIS — Z9181 History of falling: Secondary | ICD-10-CM | POA: Diagnosis not present

## 2017-02-11 DIAGNOSIS — M7061 Trochanteric bursitis, right hip: Secondary | ICD-10-CM | POA: Diagnosis not present

## 2017-02-11 DIAGNOSIS — Z9981 Dependence on supplemental oxygen: Secondary | ICD-10-CM | POA: Diagnosis not present

## 2017-02-11 DIAGNOSIS — I5022 Chronic systolic (congestive) heart failure: Secondary | ICD-10-CM | POA: Diagnosis not present

## 2017-02-11 DIAGNOSIS — I4891 Unspecified atrial fibrillation: Secondary | ICD-10-CM | POA: Diagnosis not present

## 2017-02-12 DIAGNOSIS — Z7901 Long term (current) use of anticoagulants: Secondary | ICD-10-CM | POA: Diagnosis not present

## 2017-02-12 DIAGNOSIS — E039 Hypothyroidism, unspecified: Secondary | ICD-10-CM | POA: Diagnosis not present

## 2017-02-12 DIAGNOSIS — I110 Hypertensive heart disease with heart failure: Secondary | ICD-10-CM | POA: Diagnosis not present

## 2017-02-12 DIAGNOSIS — M7062 Trochanteric bursitis, left hip: Secondary | ICD-10-CM | POA: Diagnosis not present

## 2017-02-12 DIAGNOSIS — I5022 Chronic systolic (congestive) heart failure: Secondary | ICD-10-CM | POA: Diagnosis not present

## 2017-02-12 DIAGNOSIS — M5136 Other intervertebral disc degeneration, lumbar region: Secondary | ICD-10-CM | POA: Diagnosis not present

## 2017-02-12 DIAGNOSIS — Z9181 History of falling: Secondary | ICD-10-CM | POA: Diagnosis not present

## 2017-02-12 DIAGNOSIS — M7061 Trochanteric bursitis, right hip: Secondary | ICD-10-CM | POA: Diagnosis not present

## 2017-02-12 DIAGNOSIS — I4891 Unspecified atrial fibrillation: Secondary | ICD-10-CM | POA: Diagnosis not present

## 2017-02-12 DIAGNOSIS — Z9981 Dependence on supplemental oxygen: Secondary | ICD-10-CM | POA: Diagnosis not present

## 2017-02-12 DIAGNOSIS — E119 Type 2 diabetes mellitus without complications: Secondary | ICD-10-CM | POA: Diagnosis not present

## 2017-02-12 DIAGNOSIS — M6281 Muscle weakness (generalized): Secondary | ICD-10-CM | POA: Diagnosis not present

## 2017-02-12 DIAGNOSIS — Z7984 Long term (current) use of oral hypoglycemic drugs: Secondary | ICD-10-CM | POA: Diagnosis not present

## 2017-02-14 DIAGNOSIS — Z7901 Long term (current) use of anticoagulants: Secondary | ICD-10-CM | POA: Diagnosis not present

## 2017-02-14 DIAGNOSIS — E119 Type 2 diabetes mellitus without complications: Secondary | ICD-10-CM | POA: Diagnosis not present

## 2017-02-14 DIAGNOSIS — Z9981 Dependence on supplemental oxygen: Secondary | ICD-10-CM | POA: Diagnosis not present

## 2017-02-14 DIAGNOSIS — I110 Hypertensive heart disease with heart failure: Secondary | ICD-10-CM | POA: Diagnosis not present

## 2017-02-14 DIAGNOSIS — Z9181 History of falling: Secondary | ICD-10-CM | POA: Diagnosis not present

## 2017-02-14 DIAGNOSIS — M7061 Trochanteric bursitis, right hip: Secondary | ICD-10-CM | POA: Diagnosis not present

## 2017-02-14 DIAGNOSIS — M7062 Trochanteric bursitis, left hip: Secondary | ICD-10-CM | POA: Diagnosis not present

## 2017-02-14 DIAGNOSIS — M5136 Other intervertebral disc degeneration, lumbar region: Secondary | ICD-10-CM | POA: Diagnosis not present

## 2017-02-14 DIAGNOSIS — M6281 Muscle weakness (generalized): Secondary | ICD-10-CM | POA: Diagnosis not present

## 2017-02-14 DIAGNOSIS — E039 Hypothyroidism, unspecified: Secondary | ICD-10-CM | POA: Diagnosis not present

## 2017-02-14 DIAGNOSIS — I5022 Chronic systolic (congestive) heart failure: Secondary | ICD-10-CM | POA: Diagnosis not present

## 2017-02-14 DIAGNOSIS — Z7984 Long term (current) use of oral hypoglycemic drugs: Secondary | ICD-10-CM | POA: Diagnosis not present

## 2017-02-14 DIAGNOSIS — I4891 Unspecified atrial fibrillation: Secondary | ICD-10-CM | POA: Diagnosis not present

## 2017-02-17 DIAGNOSIS — I5022 Chronic systolic (congestive) heart failure: Secondary | ICD-10-CM | POA: Diagnosis not present

## 2017-02-17 DIAGNOSIS — Z7984 Long term (current) use of oral hypoglycemic drugs: Secondary | ICD-10-CM | POA: Diagnosis not present

## 2017-02-17 DIAGNOSIS — M7062 Trochanteric bursitis, left hip: Secondary | ICD-10-CM | POA: Diagnosis not present

## 2017-02-17 DIAGNOSIS — M5136 Other intervertebral disc degeneration, lumbar region: Secondary | ICD-10-CM | POA: Diagnosis not present

## 2017-02-17 DIAGNOSIS — Z9981 Dependence on supplemental oxygen: Secondary | ICD-10-CM | POA: Diagnosis not present

## 2017-02-17 DIAGNOSIS — Z9181 History of falling: Secondary | ICD-10-CM | POA: Diagnosis not present

## 2017-02-17 DIAGNOSIS — Z7901 Long term (current) use of anticoagulants: Secondary | ICD-10-CM | POA: Diagnosis not present

## 2017-02-17 DIAGNOSIS — M6281 Muscle weakness (generalized): Secondary | ICD-10-CM | POA: Diagnosis not present

## 2017-02-17 DIAGNOSIS — I4891 Unspecified atrial fibrillation: Secondary | ICD-10-CM | POA: Diagnosis not present

## 2017-02-17 DIAGNOSIS — E119 Type 2 diabetes mellitus without complications: Secondary | ICD-10-CM | POA: Diagnosis not present

## 2017-02-17 DIAGNOSIS — E039 Hypothyroidism, unspecified: Secondary | ICD-10-CM | POA: Diagnosis not present

## 2017-02-17 DIAGNOSIS — I110 Hypertensive heart disease with heart failure: Secondary | ICD-10-CM | POA: Diagnosis not present

## 2017-02-17 DIAGNOSIS — M7061 Trochanteric bursitis, right hip: Secondary | ICD-10-CM | POA: Diagnosis not present

## 2017-02-18 DIAGNOSIS — M5136 Other intervertebral disc degeneration, lumbar region: Secondary | ICD-10-CM | POA: Diagnosis not present

## 2017-02-18 DIAGNOSIS — I5022 Chronic systolic (congestive) heart failure: Secondary | ICD-10-CM | POA: Diagnosis not present

## 2017-02-18 DIAGNOSIS — M7061 Trochanteric bursitis, right hip: Secondary | ICD-10-CM | POA: Diagnosis not present

## 2017-02-18 DIAGNOSIS — I110 Hypertensive heart disease with heart failure: Secondary | ICD-10-CM | POA: Diagnosis not present

## 2017-02-18 DIAGNOSIS — I4891 Unspecified atrial fibrillation: Secondary | ICD-10-CM | POA: Diagnosis not present

## 2017-02-18 DIAGNOSIS — E039 Hypothyroidism, unspecified: Secondary | ICD-10-CM | POA: Diagnosis not present

## 2017-02-18 DIAGNOSIS — Z9981 Dependence on supplemental oxygen: Secondary | ICD-10-CM | POA: Diagnosis not present

## 2017-02-18 DIAGNOSIS — M6281 Muscle weakness (generalized): Secondary | ICD-10-CM | POA: Diagnosis not present

## 2017-02-18 DIAGNOSIS — Z9181 History of falling: Secondary | ICD-10-CM | POA: Diagnosis not present

## 2017-02-18 DIAGNOSIS — Z7901 Long term (current) use of anticoagulants: Secondary | ICD-10-CM | POA: Diagnosis not present

## 2017-02-18 DIAGNOSIS — Z7984 Long term (current) use of oral hypoglycemic drugs: Secondary | ICD-10-CM | POA: Diagnosis not present

## 2017-02-18 DIAGNOSIS — E119 Type 2 diabetes mellitus without complications: Secondary | ICD-10-CM | POA: Diagnosis not present

## 2017-02-18 DIAGNOSIS — M7062 Trochanteric bursitis, left hip: Secondary | ICD-10-CM | POA: Diagnosis not present

## 2017-02-20 DIAGNOSIS — M7061 Trochanteric bursitis, right hip: Secondary | ICD-10-CM | POA: Diagnosis not present

## 2017-02-20 DIAGNOSIS — Z9981 Dependence on supplemental oxygen: Secondary | ICD-10-CM | POA: Diagnosis not present

## 2017-02-20 DIAGNOSIS — I4891 Unspecified atrial fibrillation: Secondary | ICD-10-CM | POA: Diagnosis not present

## 2017-02-20 DIAGNOSIS — I5022 Chronic systolic (congestive) heart failure: Secondary | ICD-10-CM | POA: Diagnosis not present

## 2017-02-20 DIAGNOSIS — I110 Hypertensive heart disease with heart failure: Secondary | ICD-10-CM | POA: Diagnosis not present

## 2017-02-20 DIAGNOSIS — E119 Type 2 diabetes mellitus without complications: Secondary | ICD-10-CM | POA: Diagnosis not present

## 2017-02-20 DIAGNOSIS — Z7984 Long term (current) use of oral hypoglycemic drugs: Secondary | ICD-10-CM | POA: Diagnosis not present

## 2017-02-20 DIAGNOSIS — M5136 Other intervertebral disc degeneration, lumbar region: Secondary | ICD-10-CM | POA: Diagnosis not present

## 2017-02-20 DIAGNOSIS — E039 Hypothyroidism, unspecified: Secondary | ICD-10-CM | POA: Diagnosis not present

## 2017-02-20 DIAGNOSIS — M7062 Trochanteric bursitis, left hip: Secondary | ICD-10-CM | POA: Diagnosis not present

## 2017-02-20 DIAGNOSIS — M6281 Muscle weakness (generalized): Secondary | ICD-10-CM | POA: Diagnosis not present

## 2017-02-20 DIAGNOSIS — Z9181 History of falling: Secondary | ICD-10-CM | POA: Diagnosis not present

## 2017-02-20 DIAGNOSIS — Z7901 Long term (current) use of anticoagulants: Secondary | ICD-10-CM | POA: Diagnosis not present

## 2017-02-23 DIAGNOSIS — I4891 Unspecified atrial fibrillation: Secondary | ICD-10-CM | POA: Diagnosis not present

## 2017-02-23 DIAGNOSIS — Z7901 Long term (current) use of anticoagulants: Secondary | ICD-10-CM | POA: Diagnosis not present

## 2017-02-23 DIAGNOSIS — E119 Type 2 diabetes mellitus without complications: Secondary | ICD-10-CM | POA: Diagnosis not present

## 2017-02-23 DIAGNOSIS — M7062 Trochanteric bursitis, left hip: Secondary | ICD-10-CM | POA: Diagnosis not present

## 2017-02-23 DIAGNOSIS — Z9981 Dependence on supplemental oxygen: Secondary | ICD-10-CM | POA: Diagnosis not present

## 2017-02-23 DIAGNOSIS — M7061 Trochanteric bursitis, right hip: Secondary | ICD-10-CM | POA: Diagnosis not present

## 2017-02-23 DIAGNOSIS — M5136 Other intervertebral disc degeneration, lumbar region: Secondary | ICD-10-CM | POA: Diagnosis not present

## 2017-02-23 DIAGNOSIS — M6281 Muscle weakness (generalized): Secondary | ICD-10-CM | POA: Diagnosis not present

## 2017-02-23 DIAGNOSIS — I5022 Chronic systolic (congestive) heart failure: Secondary | ICD-10-CM | POA: Diagnosis not present

## 2017-02-23 DIAGNOSIS — I110 Hypertensive heart disease with heart failure: Secondary | ICD-10-CM | POA: Diagnosis not present

## 2017-02-23 DIAGNOSIS — Z7984 Long term (current) use of oral hypoglycemic drugs: Secondary | ICD-10-CM | POA: Diagnosis not present

## 2017-02-23 DIAGNOSIS — E039 Hypothyroidism, unspecified: Secondary | ICD-10-CM | POA: Diagnosis not present

## 2017-02-23 DIAGNOSIS — Z9181 History of falling: Secondary | ICD-10-CM | POA: Diagnosis not present

## 2017-02-24 DIAGNOSIS — M6281 Muscle weakness (generalized): Secondary | ICD-10-CM | POA: Diagnosis not present

## 2017-02-24 DIAGNOSIS — M7062 Trochanteric bursitis, left hip: Secondary | ICD-10-CM | POA: Diagnosis not present

## 2017-02-24 DIAGNOSIS — E039 Hypothyroidism, unspecified: Secondary | ICD-10-CM | POA: Diagnosis not present

## 2017-02-24 DIAGNOSIS — Z9981 Dependence on supplemental oxygen: Secondary | ICD-10-CM | POA: Diagnosis not present

## 2017-02-24 DIAGNOSIS — I4891 Unspecified atrial fibrillation: Secondary | ICD-10-CM | POA: Diagnosis not present

## 2017-02-24 DIAGNOSIS — I5022 Chronic systolic (congestive) heart failure: Secondary | ICD-10-CM | POA: Diagnosis not present

## 2017-02-24 DIAGNOSIS — Z7984 Long term (current) use of oral hypoglycemic drugs: Secondary | ICD-10-CM | POA: Diagnosis not present

## 2017-02-24 DIAGNOSIS — Z9181 History of falling: Secondary | ICD-10-CM | POA: Diagnosis not present

## 2017-02-24 DIAGNOSIS — I110 Hypertensive heart disease with heart failure: Secondary | ICD-10-CM | POA: Diagnosis not present

## 2017-02-24 DIAGNOSIS — M7061 Trochanteric bursitis, right hip: Secondary | ICD-10-CM | POA: Diagnosis not present

## 2017-02-24 DIAGNOSIS — M5136 Other intervertebral disc degeneration, lumbar region: Secondary | ICD-10-CM | POA: Diagnosis not present

## 2017-02-24 DIAGNOSIS — Z7901 Long term (current) use of anticoagulants: Secondary | ICD-10-CM | POA: Diagnosis not present

## 2017-02-24 DIAGNOSIS — E119 Type 2 diabetes mellitus without complications: Secondary | ICD-10-CM | POA: Diagnosis not present

## 2017-02-26 DIAGNOSIS — Z9981 Dependence on supplemental oxygen: Secondary | ICD-10-CM | POA: Diagnosis not present

## 2017-02-26 DIAGNOSIS — I4891 Unspecified atrial fibrillation: Secondary | ICD-10-CM | POA: Diagnosis not present

## 2017-02-26 DIAGNOSIS — Z7901 Long term (current) use of anticoagulants: Secondary | ICD-10-CM | POA: Diagnosis not present

## 2017-02-26 DIAGNOSIS — M5136 Other intervertebral disc degeneration, lumbar region: Secondary | ICD-10-CM | POA: Diagnosis not present

## 2017-02-26 DIAGNOSIS — E119 Type 2 diabetes mellitus without complications: Secondary | ICD-10-CM | POA: Diagnosis not present

## 2017-02-26 DIAGNOSIS — M6281 Muscle weakness (generalized): Secondary | ICD-10-CM | POA: Diagnosis not present

## 2017-02-26 DIAGNOSIS — E039 Hypothyroidism, unspecified: Secondary | ICD-10-CM | POA: Diagnosis not present

## 2017-02-26 DIAGNOSIS — Z9181 History of falling: Secondary | ICD-10-CM | POA: Diagnosis not present

## 2017-02-26 DIAGNOSIS — Z7984 Long term (current) use of oral hypoglycemic drugs: Secondary | ICD-10-CM | POA: Diagnosis not present

## 2017-02-26 DIAGNOSIS — M7062 Trochanteric bursitis, left hip: Secondary | ICD-10-CM | POA: Diagnosis not present

## 2017-02-26 DIAGNOSIS — I5022 Chronic systolic (congestive) heart failure: Secondary | ICD-10-CM | POA: Diagnosis not present

## 2017-02-26 DIAGNOSIS — I110 Hypertensive heart disease with heart failure: Secondary | ICD-10-CM | POA: Diagnosis not present

## 2017-02-26 DIAGNOSIS — M7061 Trochanteric bursitis, right hip: Secondary | ICD-10-CM | POA: Diagnosis not present

## 2017-02-27 ENCOUNTER — Ambulatory Visit: Payer: Medicare Other | Admitting: Sports Medicine

## 2017-02-27 DIAGNOSIS — Z0189 Encounter for other specified special examinations: Secondary | ICD-10-CM

## 2017-03-01 DIAGNOSIS — I5030 Unspecified diastolic (congestive) heart failure: Secondary | ICD-10-CM | POA: Diagnosis not present

## 2017-03-01 DIAGNOSIS — R0602 Shortness of breath: Secondary | ICD-10-CM | POA: Diagnosis not present

## 2017-03-02 DIAGNOSIS — R739 Hyperglycemia, unspecified: Secondary | ICD-10-CM | POA: Diagnosis not present

## 2017-03-02 DIAGNOSIS — E7800 Pure hypercholesterolemia, unspecified: Secondary | ICD-10-CM | POA: Diagnosis not present

## 2017-03-02 DIAGNOSIS — R300 Dysuria: Secondary | ICD-10-CM | POA: Diagnosis not present

## 2017-03-04 DIAGNOSIS — I10 Essential (primary) hypertension: Secondary | ICD-10-CM | POA: Diagnosis not present

## 2017-03-04 DIAGNOSIS — R300 Dysuria: Secondary | ICD-10-CM | POA: Diagnosis not present

## 2017-03-04 DIAGNOSIS — E7800 Pure hypercholesterolemia, unspecified: Secondary | ICD-10-CM | POA: Diagnosis not present

## 2017-03-04 DIAGNOSIS — R739 Hyperglycemia, unspecified: Secondary | ICD-10-CM | POA: Diagnosis not present

## 2017-03-05 DIAGNOSIS — M7061 Trochanteric bursitis, right hip: Secondary | ICD-10-CM | POA: Diagnosis not present

## 2017-03-05 DIAGNOSIS — M7062 Trochanteric bursitis, left hip: Secondary | ICD-10-CM | POA: Diagnosis not present

## 2017-03-05 DIAGNOSIS — I5022 Chronic systolic (congestive) heart failure: Secondary | ICD-10-CM | POA: Diagnosis not present

## 2017-03-05 DIAGNOSIS — Z9181 History of falling: Secondary | ICD-10-CM | POA: Diagnosis not present

## 2017-03-05 DIAGNOSIS — E039 Hypothyroidism, unspecified: Secondary | ICD-10-CM | POA: Diagnosis not present

## 2017-03-05 DIAGNOSIS — Z7984 Long term (current) use of oral hypoglycemic drugs: Secondary | ICD-10-CM | POA: Diagnosis not present

## 2017-03-05 DIAGNOSIS — I110 Hypertensive heart disease with heart failure: Secondary | ICD-10-CM | POA: Diagnosis not present

## 2017-03-05 DIAGNOSIS — M5136 Other intervertebral disc degeneration, lumbar region: Secondary | ICD-10-CM | POA: Diagnosis not present

## 2017-03-05 DIAGNOSIS — Z7901 Long term (current) use of anticoagulants: Secondary | ICD-10-CM | POA: Diagnosis not present

## 2017-03-05 DIAGNOSIS — I4891 Unspecified atrial fibrillation: Secondary | ICD-10-CM | POA: Diagnosis not present

## 2017-03-05 DIAGNOSIS — Z9981 Dependence on supplemental oxygen: Secondary | ICD-10-CM | POA: Diagnosis not present

## 2017-03-05 DIAGNOSIS — E119 Type 2 diabetes mellitus without complications: Secondary | ICD-10-CM | POA: Diagnosis not present

## 2017-03-05 DIAGNOSIS — M6281 Muscle weakness (generalized): Secondary | ICD-10-CM | POA: Diagnosis not present

## 2017-03-06 DIAGNOSIS — I4891 Unspecified atrial fibrillation: Secondary | ICD-10-CM | POA: Diagnosis not present

## 2017-03-06 DIAGNOSIS — M7061 Trochanteric bursitis, right hip: Secondary | ICD-10-CM | POA: Diagnosis not present

## 2017-03-06 DIAGNOSIS — M5136 Other intervertebral disc degeneration, lumbar region: Secondary | ICD-10-CM | POA: Diagnosis not present

## 2017-03-06 DIAGNOSIS — Z9981 Dependence on supplemental oxygen: Secondary | ICD-10-CM | POA: Diagnosis not present

## 2017-03-06 DIAGNOSIS — I5022 Chronic systolic (congestive) heart failure: Secondary | ICD-10-CM | POA: Diagnosis not present

## 2017-03-06 DIAGNOSIS — M6281 Muscle weakness (generalized): Secondary | ICD-10-CM | POA: Diagnosis not present

## 2017-03-06 DIAGNOSIS — I110 Hypertensive heart disease with heart failure: Secondary | ICD-10-CM | POA: Diagnosis not present

## 2017-03-06 DIAGNOSIS — Z7901 Long term (current) use of anticoagulants: Secondary | ICD-10-CM | POA: Diagnosis not present

## 2017-03-06 DIAGNOSIS — Z7984 Long term (current) use of oral hypoglycemic drugs: Secondary | ICD-10-CM | POA: Diagnosis not present

## 2017-03-06 DIAGNOSIS — Z9181 History of falling: Secondary | ICD-10-CM | POA: Diagnosis not present

## 2017-03-06 DIAGNOSIS — E119 Type 2 diabetes mellitus without complications: Secondary | ICD-10-CM | POA: Diagnosis not present

## 2017-03-06 DIAGNOSIS — M7062 Trochanteric bursitis, left hip: Secondary | ICD-10-CM | POA: Diagnosis not present

## 2017-03-06 DIAGNOSIS — E039 Hypothyroidism, unspecified: Secondary | ICD-10-CM | POA: Diagnosis not present

## 2017-03-11 DIAGNOSIS — Z9981 Dependence on supplemental oxygen: Secondary | ICD-10-CM | POA: Diagnosis not present

## 2017-03-11 DIAGNOSIS — M7061 Trochanteric bursitis, right hip: Secondary | ICD-10-CM | POA: Diagnosis not present

## 2017-03-11 DIAGNOSIS — I5022 Chronic systolic (congestive) heart failure: Secondary | ICD-10-CM | POA: Diagnosis not present

## 2017-03-11 DIAGNOSIS — I110 Hypertensive heart disease with heart failure: Secondary | ICD-10-CM | POA: Diagnosis not present

## 2017-03-11 DIAGNOSIS — M6281 Muscle weakness (generalized): Secondary | ICD-10-CM | POA: Diagnosis not present

## 2017-03-11 DIAGNOSIS — I4891 Unspecified atrial fibrillation: Secondary | ICD-10-CM | POA: Diagnosis not present

## 2017-03-11 DIAGNOSIS — M7062 Trochanteric bursitis, left hip: Secondary | ICD-10-CM | POA: Diagnosis not present

## 2017-03-11 DIAGNOSIS — E119 Type 2 diabetes mellitus without complications: Secondary | ICD-10-CM | POA: Diagnosis not present

## 2017-03-11 DIAGNOSIS — M5136 Other intervertebral disc degeneration, lumbar region: Secondary | ICD-10-CM | POA: Diagnosis not present

## 2017-03-11 DIAGNOSIS — Z7984 Long term (current) use of oral hypoglycemic drugs: Secondary | ICD-10-CM | POA: Diagnosis not present

## 2017-03-11 DIAGNOSIS — Z9181 History of falling: Secondary | ICD-10-CM | POA: Diagnosis not present

## 2017-03-11 DIAGNOSIS — E039 Hypothyroidism, unspecified: Secondary | ICD-10-CM | POA: Diagnosis not present

## 2017-03-11 DIAGNOSIS — Z7901 Long term (current) use of anticoagulants: Secondary | ICD-10-CM | POA: Diagnosis not present

## 2017-03-13 DIAGNOSIS — E876 Hypokalemia: Secondary | ICD-10-CM | POA: Diagnosis not present

## 2017-03-13 DIAGNOSIS — R8290 Unspecified abnormal findings in urine: Secondary | ICD-10-CM | POA: Diagnosis not present

## 2017-03-16 DIAGNOSIS — Z7901 Long term (current) use of anticoagulants: Secondary | ICD-10-CM | POA: Diagnosis not present

## 2017-03-16 DIAGNOSIS — Z9981 Dependence on supplemental oxygen: Secondary | ICD-10-CM | POA: Diagnosis not present

## 2017-03-16 DIAGNOSIS — M6281 Muscle weakness (generalized): Secondary | ICD-10-CM | POA: Diagnosis not present

## 2017-03-16 DIAGNOSIS — M7061 Trochanteric bursitis, right hip: Secondary | ICD-10-CM | POA: Diagnosis not present

## 2017-03-16 DIAGNOSIS — M5136 Other intervertebral disc degeneration, lumbar region: Secondary | ICD-10-CM | POA: Diagnosis not present

## 2017-03-16 DIAGNOSIS — E119 Type 2 diabetes mellitus without complications: Secondary | ICD-10-CM | POA: Diagnosis not present

## 2017-03-16 DIAGNOSIS — Z7984 Long term (current) use of oral hypoglycemic drugs: Secondary | ICD-10-CM | POA: Diagnosis not present

## 2017-03-16 DIAGNOSIS — E039 Hypothyroidism, unspecified: Secondary | ICD-10-CM | POA: Diagnosis not present

## 2017-03-16 DIAGNOSIS — M7062 Trochanteric bursitis, left hip: Secondary | ICD-10-CM | POA: Diagnosis not present

## 2017-03-16 DIAGNOSIS — I4891 Unspecified atrial fibrillation: Secondary | ICD-10-CM | POA: Diagnosis not present

## 2017-03-16 DIAGNOSIS — I5022 Chronic systolic (congestive) heart failure: Secondary | ICD-10-CM | POA: Diagnosis not present

## 2017-03-16 DIAGNOSIS — I110 Hypertensive heart disease with heart failure: Secondary | ICD-10-CM | POA: Diagnosis not present

## 2017-03-16 DIAGNOSIS — Z9181 History of falling: Secondary | ICD-10-CM | POA: Diagnosis not present

## 2017-03-18 DIAGNOSIS — E039 Hypothyroidism, unspecified: Secondary | ICD-10-CM | POA: Diagnosis not present

## 2017-03-18 DIAGNOSIS — Z7901 Long term (current) use of anticoagulants: Secondary | ICD-10-CM | POA: Diagnosis not present

## 2017-03-18 DIAGNOSIS — M7062 Trochanteric bursitis, left hip: Secondary | ICD-10-CM | POA: Diagnosis not present

## 2017-03-18 DIAGNOSIS — M6281 Muscle weakness (generalized): Secondary | ICD-10-CM | POA: Diagnosis not present

## 2017-03-18 DIAGNOSIS — I110 Hypertensive heart disease with heart failure: Secondary | ICD-10-CM | POA: Diagnosis not present

## 2017-03-18 DIAGNOSIS — E119 Type 2 diabetes mellitus without complications: Secondary | ICD-10-CM | POA: Diagnosis not present

## 2017-03-18 DIAGNOSIS — Z9981 Dependence on supplemental oxygen: Secondary | ICD-10-CM | POA: Diagnosis not present

## 2017-03-18 DIAGNOSIS — M5136 Other intervertebral disc degeneration, lumbar region: Secondary | ICD-10-CM | POA: Diagnosis not present

## 2017-03-18 DIAGNOSIS — M7061 Trochanteric bursitis, right hip: Secondary | ICD-10-CM | POA: Diagnosis not present

## 2017-03-18 DIAGNOSIS — I4891 Unspecified atrial fibrillation: Secondary | ICD-10-CM | POA: Diagnosis not present

## 2017-03-18 DIAGNOSIS — I5022 Chronic systolic (congestive) heart failure: Secondary | ICD-10-CM | POA: Diagnosis not present

## 2017-03-18 DIAGNOSIS — Z7984 Long term (current) use of oral hypoglycemic drugs: Secondary | ICD-10-CM | POA: Diagnosis not present

## 2017-03-18 DIAGNOSIS — Z9181 History of falling: Secondary | ICD-10-CM | POA: Diagnosis not present

## 2017-04-01 DIAGNOSIS — I5030 Unspecified diastolic (congestive) heart failure: Secondary | ICD-10-CM | POA: Diagnosis not present

## 2017-04-01 DIAGNOSIS — R0602 Shortness of breath: Secondary | ICD-10-CM | POA: Diagnosis not present

## 2017-04-02 DIAGNOSIS — Z87440 Personal history of urinary (tract) infections: Secondary | ICD-10-CM | POA: Diagnosis not present

## 2017-04-02 DIAGNOSIS — L309 Dermatitis, unspecified: Secondary | ICD-10-CM | POA: Diagnosis not present

## 2017-04-02 DIAGNOSIS — R300 Dysuria: Secondary | ICD-10-CM | POA: Diagnosis not present

## 2017-04-03 DIAGNOSIS — R350 Frequency of micturition: Secondary | ICD-10-CM | POA: Diagnosis not present

## 2017-04-03 DIAGNOSIS — R300 Dysuria: Secondary | ICD-10-CM | POA: Diagnosis not present

## 2017-04-03 DIAGNOSIS — R3915 Urgency of urination: Secondary | ICD-10-CM | POA: Diagnosis not present

## 2017-04-03 DIAGNOSIS — Z87440 Personal history of urinary (tract) infections: Secondary | ICD-10-CM | POA: Diagnosis not present

## 2017-04-27 DIAGNOSIS — N390 Urinary tract infection, site not specified: Secondary | ICD-10-CM | POA: Diagnosis not present

## 2017-04-27 DIAGNOSIS — R3989 Other symptoms and signs involving the genitourinary system: Secondary | ICD-10-CM | POA: Diagnosis not present

## 2017-05-01 DIAGNOSIS — Q632 Ectopic kidney: Secondary | ICD-10-CM | POA: Diagnosis not present

## 2017-05-01 DIAGNOSIS — R3989 Other symptoms and signs involving the genitourinary system: Secondary | ICD-10-CM | POA: Diagnosis not present

## 2017-05-01 DIAGNOSIS — K8689 Other specified diseases of pancreas: Secondary | ICD-10-CM | POA: Diagnosis not present

## 2017-05-01 DIAGNOSIS — R319 Hematuria, unspecified: Secondary | ICD-10-CM | POA: Diagnosis not present

## 2017-05-01 DIAGNOSIS — N390 Urinary tract infection, site not specified: Secondary | ICD-10-CM | POA: Diagnosis not present

## 2017-05-01 DIAGNOSIS — I722 Aneurysm of renal artery: Secondary | ICD-10-CM | POA: Diagnosis not present

## 2017-05-02 DIAGNOSIS — R0602 Shortness of breath: Secondary | ICD-10-CM | POA: Diagnosis not present

## 2017-05-02 DIAGNOSIS — I5030 Unspecified diastolic (congestive) heart failure: Secondary | ICD-10-CM | POA: Diagnosis not present

## 2017-05-08 DIAGNOSIS — N390 Urinary tract infection, site not specified: Secondary | ICD-10-CM | POA: Diagnosis not present

## 2017-05-30 DIAGNOSIS — I5030 Unspecified diastolic (congestive) heart failure: Secondary | ICD-10-CM | POA: Diagnosis not present

## 2017-05-30 DIAGNOSIS — R0602 Shortness of breath: Secondary | ICD-10-CM | POA: Diagnosis not present

## 2017-06-15 DIAGNOSIS — R8290 Unspecified abnormal findings in urine: Secondary | ICD-10-CM | POA: Diagnosis not present

## 2017-06-17 DIAGNOSIS — R300 Dysuria: Secondary | ICD-10-CM | POA: Diagnosis not present

## 2017-06-17 DIAGNOSIS — R8290 Unspecified abnormal findings in urine: Secondary | ICD-10-CM | POA: Diagnosis not present

## 2017-06-17 DIAGNOSIS — Z87440 Personal history of urinary (tract) infections: Secondary | ICD-10-CM | POA: Diagnosis not present

## 2017-06-19 DIAGNOSIS — N390 Urinary tract infection, site not specified: Secondary | ICD-10-CM | POA: Diagnosis not present

## 2017-06-26 DIAGNOSIS — Z882 Allergy status to sulfonamides status: Secondary | ICD-10-CM | POA: Diagnosis not present

## 2017-06-26 DIAGNOSIS — K219 Gastro-esophageal reflux disease without esophagitis: Secondary | ICD-10-CM | POA: Diagnosis not present

## 2017-06-26 DIAGNOSIS — E119 Type 2 diabetes mellitus without complications: Secondary | ICD-10-CM | POA: Diagnosis not present

## 2017-06-26 DIAGNOSIS — Z79899 Other long term (current) drug therapy: Secondary | ICD-10-CM | POA: Diagnosis not present

## 2017-06-26 DIAGNOSIS — R51 Headache: Secondary | ICD-10-CM | POA: Diagnosis not present

## 2017-06-26 DIAGNOSIS — S0990XA Unspecified injury of head, initial encounter: Secondary | ICD-10-CM | POA: Diagnosis not present

## 2017-06-26 DIAGNOSIS — Z886 Allergy status to analgesic agent status: Secondary | ICD-10-CM | POA: Diagnosis not present

## 2017-06-26 DIAGNOSIS — Z043 Encounter for examination and observation following other accident: Secondary | ICD-10-CM | POA: Diagnosis not present

## 2017-06-26 DIAGNOSIS — Z791 Long term (current) use of non-steroidal anti-inflammatories (NSAID): Secondary | ICD-10-CM | POA: Diagnosis not present

## 2017-06-26 DIAGNOSIS — Z7901 Long term (current) use of anticoagulants: Secondary | ICD-10-CM | POA: Diagnosis not present

## 2017-06-26 DIAGNOSIS — I6781 Acute cerebrovascular insufficiency: Secondary | ICD-10-CM | POA: Diagnosis not present

## 2017-06-26 DIAGNOSIS — Z880 Allergy status to penicillin: Secondary | ICD-10-CM | POA: Diagnosis not present

## 2017-06-26 DIAGNOSIS — E039 Hypothyroidism, unspecified: Secondary | ICD-10-CM | POA: Diagnosis not present

## 2017-06-26 DIAGNOSIS — W1830XA Fall on same level, unspecified, initial encounter: Secondary | ICD-10-CM | POA: Diagnosis not present

## 2017-06-26 DIAGNOSIS — Z7984 Long term (current) use of oral hypoglycemic drugs: Secondary | ICD-10-CM | POA: Diagnosis not present

## 2017-06-26 DIAGNOSIS — I4891 Unspecified atrial fibrillation: Secondary | ICD-10-CM | POA: Diagnosis not present

## 2017-06-26 DIAGNOSIS — Z885 Allergy status to narcotic agent status: Secondary | ICD-10-CM | POA: Diagnosis not present

## 2017-06-26 DIAGNOSIS — I10 Essential (primary) hypertension: Secondary | ICD-10-CM | POA: Diagnosis not present

## 2017-06-27 DIAGNOSIS — I6781 Acute cerebrovascular insufficiency: Secondary | ICD-10-CM | POA: Diagnosis not present

## 2017-06-27 DIAGNOSIS — S0990XA Unspecified injury of head, initial encounter: Secondary | ICD-10-CM | POA: Diagnosis not present

## 2017-06-30 DIAGNOSIS — I5030 Unspecified diastolic (congestive) heart failure: Secondary | ICD-10-CM | POA: Diagnosis not present

## 2017-06-30 DIAGNOSIS — R0602 Shortness of breath: Secondary | ICD-10-CM | POA: Diagnosis not present

## 2017-07-01 ENCOUNTER — Other Ambulatory Visit: Payer: Self-pay | Admitting: Sports Medicine

## 2017-07-01 DIAGNOSIS — M5136 Other intervertebral disc degeneration, lumbar region: Secondary | ICD-10-CM

## 2017-07-20 DIAGNOSIS — R300 Dysuria: Secondary | ICD-10-CM | POA: Diagnosis not present

## 2017-07-20 DIAGNOSIS — E1140 Type 2 diabetes mellitus with diabetic neuropathy, unspecified: Secondary | ICD-10-CM | POA: Diagnosis not present

## 2017-07-20 DIAGNOSIS — I10 Essential (primary) hypertension: Secondary | ICD-10-CM | POA: Diagnosis not present

## 2017-07-20 DIAGNOSIS — E039 Hypothyroidism, unspecified: Secondary | ICD-10-CM | POA: Diagnosis not present

## 2017-07-20 DIAGNOSIS — E7800 Pure hypercholesterolemia, unspecified: Secondary | ICD-10-CM | POA: Diagnosis not present

## 2017-07-20 DIAGNOSIS — R5383 Other fatigue: Secondary | ICD-10-CM | POA: Diagnosis not present

## 2017-07-24 DIAGNOSIS — N390 Urinary tract infection, site not specified: Secondary | ICD-10-CM | POA: Diagnosis not present

## 2017-07-26 DIAGNOSIS — I4891 Unspecified atrial fibrillation: Secondary | ICD-10-CM | POA: Diagnosis not present

## 2017-07-26 DIAGNOSIS — Z7984 Long term (current) use of oral hypoglycemic drugs: Secondary | ICD-10-CM | POA: Diagnosis not present

## 2017-07-26 DIAGNOSIS — Z7901 Long term (current) use of anticoagulants: Secondary | ICD-10-CM | POA: Diagnosis not present

## 2017-07-26 DIAGNOSIS — S6991XA Unspecified injury of right wrist, hand and finger(s), initial encounter: Secondary | ICD-10-CM | POA: Diagnosis not present

## 2017-07-26 DIAGNOSIS — R42 Dizziness and giddiness: Secondary | ICD-10-CM | POA: Diagnosis not present

## 2017-07-26 DIAGNOSIS — R404 Transient alteration of awareness: Secondary | ICD-10-CM | POA: Diagnosis not present

## 2017-07-26 DIAGNOSIS — I5032 Chronic diastolic (congestive) heart failure: Secondary | ICD-10-CM | POA: Diagnosis not present

## 2017-07-26 DIAGNOSIS — E039 Hypothyroidism, unspecified: Secondary | ICD-10-CM | POA: Diagnosis not present

## 2017-07-26 DIAGNOSIS — S0990XA Unspecified injury of head, initial encounter: Secondary | ICD-10-CM | POA: Diagnosis not present

## 2017-07-26 DIAGNOSIS — K219 Gastro-esophageal reflux disease without esophagitis: Secondary | ICD-10-CM | POA: Diagnosis not present

## 2017-07-26 DIAGNOSIS — N390 Urinary tract infection, site not specified: Secondary | ICD-10-CM | POA: Diagnosis not present

## 2017-07-26 DIAGNOSIS — M1811 Unilateral primary osteoarthritis of first carpometacarpal joint, right hand: Secondary | ICD-10-CM | POA: Diagnosis not present

## 2017-07-26 DIAGNOSIS — E785 Hyperlipidemia, unspecified: Secondary | ICD-10-CM | POA: Diagnosis not present

## 2017-07-26 DIAGNOSIS — S0083XA Contusion of other part of head, initial encounter: Secondary | ICD-10-CM | POA: Diagnosis not present

## 2017-07-26 DIAGNOSIS — E1165 Type 2 diabetes mellitus with hyperglycemia: Secondary | ICD-10-CM | POA: Diagnosis not present

## 2017-07-26 DIAGNOSIS — E876 Hypokalemia: Secondary | ICD-10-CM | POA: Diagnosis not present

## 2017-07-27 DIAGNOSIS — I5032 Chronic diastolic (congestive) heart failure: Secondary | ICD-10-CM | POA: Diagnosis not present

## 2017-07-27 DIAGNOSIS — I482 Chronic atrial fibrillation: Secondary | ICD-10-CM | POA: Diagnosis not present

## 2017-07-27 DIAGNOSIS — M1811 Unilateral primary osteoarthritis of first carpometacarpal joint, right hand: Secondary | ICD-10-CM | POA: Diagnosis not present

## 2017-07-27 DIAGNOSIS — N3000 Acute cystitis without hematuria: Secondary | ICD-10-CM | POA: Diagnosis not present

## 2017-07-27 DIAGNOSIS — Z7901 Long term (current) use of anticoagulants: Secondary | ICD-10-CM | POA: Diagnosis not present

## 2017-07-27 DIAGNOSIS — S0990XA Unspecified injury of head, initial encounter: Secondary | ICD-10-CM | POA: Diagnosis not present

## 2017-07-27 DIAGNOSIS — S6991XA Unspecified injury of right wrist, hand and finger(s), initial encounter: Secondary | ICD-10-CM | POA: Diagnosis not present

## 2017-07-28 DIAGNOSIS — Z7901 Long term (current) use of anticoagulants: Secondary | ICD-10-CM | POA: Diagnosis not present

## 2017-07-28 DIAGNOSIS — I482 Chronic atrial fibrillation: Secondary | ICD-10-CM | POA: Diagnosis not present

## 2017-07-28 DIAGNOSIS — R1310 Dysphagia, unspecified: Secondary | ICD-10-CM | POA: Diagnosis not present

## 2017-07-28 DIAGNOSIS — Z7189 Other specified counseling: Secondary | ICD-10-CM | POA: Diagnosis not present

## 2017-07-28 DIAGNOSIS — E876 Hypokalemia: Secondary | ICD-10-CM | POA: Diagnosis not present

## 2017-07-28 DIAGNOSIS — Z515 Encounter for palliative care: Secondary | ICD-10-CM | POA: Diagnosis not present

## 2017-07-28 DIAGNOSIS — R296 Repeated falls: Secondary | ICD-10-CM | POA: Diagnosis not present

## 2017-07-28 DIAGNOSIS — E039 Hypothyroidism, unspecified: Secondary | ICD-10-CM | POA: Diagnosis not present

## 2017-07-29 DIAGNOSIS — R296 Repeated falls: Secondary | ICD-10-CM | POA: Diagnosis not present

## 2017-07-29 DIAGNOSIS — Z7901 Long term (current) use of anticoagulants: Secondary | ICD-10-CM | POA: Diagnosis not present

## 2017-07-29 DIAGNOSIS — R269 Unspecified abnormalities of gait and mobility: Secondary | ICD-10-CM | POA: Diagnosis not present

## 2017-07-29 DIAGNOSIS — I110 Hypertensive heart disease with heart failure: Secondary | ICD-10-CM | POA: Diagnosis not present

## 2017-07-29 DIAGNOSIS — M810 Age-related osteoporosis without current pathological fracture: Secondary | ICD-10-CM | POA: Diagnosis not present

## 2017-07-29 DIAGNOSIS — G319 Degenerative disease of nervous system, unspecified: Secondary | ICD-10-CM | POA: Diagnosis not present

## 2017-07-29 DIAGNOSIS — N390 Urinary tract infection, site not specified: Secondary | ICD-10-CM | POA: Diagnosis not present

## 2017-07-29 DIAGNOSIS — I672 Cerebral atherosclerosis: Secondary | ICD-10-CM | POA: Diagnosis not present

## 2017-07-29 DIAGNOSIS — I6529 Occlusion and stenosis of unspecified carotid artery: Secondary | ICD-10-CM | POA: Diagnosis not present

## 2017-07-29 DIAGNOSIS — S0990XA Unspecified injury of head, initial encounter: Secondary | ICD-10-CM | POA: Diagnosis not present

## 2017-07-29 DIAGNOSIS — S0993XA Unspecified injury of face, initial encounter: Secondary | ICD-10-CM | POA: Diagnosis not present

## 2017-07-29 DIAGNOSIS — W19XXXA Unspecified fall, initial encounter: Secondary | ICD-10-CM | POA: Diagnosis not present

## 2017-07-29 DIAGNOSIS — Z9181 History of falling: Secondary | ICD-10-CM | POA: Diagnosis not present

## 2017-07-29 DIAGNOSIS — S0083XA Contusion of other part of head, initial encounter: Secondary | ICD-10-CM | POA: Diagnosis not present

## 2017-07-29 DIAGNOSIS — M1990 Unspecified osteoarthritis, unspecified site: Secondary | ICD-10-CM | POA: Diagnosis not present

## 2017-07-29 DIAGNOSIS — E876 Hypokalemia: Secondary | ICD-10-CM | POA: Diagnosis not present

## 2017-07-29 DIAGNOSIS — M6281 Muscle weakness (generalized): Secondary | ICD-10-CM | POA: Diagnosis not present

## 2017-07-29 DIAGNOSIS — Z9981 Dependence on supplemental oxygen: Secondary | ICD-10-CM | POA: Diagnosis not present

## 2017-07-29 DIAGNOSIS — I482 Chronic atrial fibrillation: Secondary | ICD-10-CM | POA: Diagnosis not present

## 2017-07-29 DIAGNOSIS — E785 Hyperlipidemia, unspecified: Secondary | ICD-10-CM | POA: Diagnosis not present

## 2017-07-29 DIAGNOSIS — I5030 Unspecified diastolic (congestive) heart failure: Secondary | ICD-10-CM | POA: Diagnosis not present

## 2017-07-29 DIAGNOSIS — N3000 Acute cystitis without hematuria: Secondary | ICD-10-CM | POA: Diagnosis not present

## 2017-07-29 DIAGNOSIS — I509 Heart failure, unspecified: Secondary | ICD-10-CM | POA: Diagnosis not present

## 2017-07-29 DIAGNOSIS — K219 Gastro-esophageal reflux disease without esophagitis: Secondary | ICD-10-CM | POA: Diagnosis not present

## 2017-07-29 DIAGNOSIS — B372 Candidiasis of skin and nail: Secondary | ICD-10-CM | POA: Diagnosis not present

## 2017-07-29 DIAGNOSIS — I4891 Unspecified atrial fibrillation: Secondary | ICD-10-CM | POA: Diagnosis not present

## 2017-07-29 DIAGNOSIS — E039 Hypothyroidism, unspecified: Secondary | ICD-10-CM | POA: Diagnosis not present

## 2017-07-29 DIAGNOSIS — E119 Type 2 diabetes mellitus without complications: Secondary | ICD-10-CM | POA: Diagnosis not present

## 2017-07-29 DIAGNOSIS — S199XXA Unspecified injury of neck, initial encounter: Secondary | ICD-10-CM | POA: Diagnosis not present

## 2017-07-29 DIAGNOSIS — Z043 Encounter for examination and observation following other accident: Secondary | ICD-10-CM | POA: Diagnosis not present

## 2017-07-29 DIAGNOSIS — G8911 Acute pain due to trauma: Secondary | ICD-10-CM | POA: Diagnosis not present

## 2017-07-29 DIAGNOSIS — E1140 Type 2 diabetes mellitus with diabetic neuropathy, unspecified: Secondary | ICD-10-CM | POA: Diagnosis not present

## 2017-07-29 DIAGNOSIS — R2689 Other abnormalities of gait and mobility: Secondary | ICD-10-CM | POA: Diagnosis not present

## 2017-07-29 DIAGNOSIS — E1165 Type 2 diabetes mellitus with hyperglycemia: Secondary | ICD-10-CM | POA: Diagnosis not present

## 2017-07-29 DIAGNOSIS — R0602 Shortness of breath: Secondary | ICD-10-CM | POA: Diagnosis not present

## 2017-07-29 DIAGNOSIS — I5032 Chronic diastolic (congestive) heart failure: Secondary | ICD-10-CM | POA: Diagnosis not present

## 2017-07-29 DIAGNOSIS — R262 Difficulty in walking, not elsewhere classified: Secondary | ICD-10-CM | POA: Diagnosis not present

## 2017-07-29 DIAGNOSIS — Z7984 Long term (current) use of oral hypoglycemic drugs: Secondary | ICD-10-CM | POA: Diagnosis not present

## 2017-07-30 DIAGNOSIS — N390 Urinary tract infection, site not specified: Secondary | ICD-10-CM | POA: Diagnosis not present

## 2017-07-30 DIAGNOSIS — M6281 Muscle weakness (generalized): Secondary | ICD-10-CM | POA: Diagnosis not present

## 2017-07-31 DIAGNOSIS — N390 Urinary tract infection, site not specified: Secondary | ICD-10-CM | POA: Diagnosis not present

## 2017-08-06 DIAGNOSIS — I672 Cerebral atherosclerosis: Secondary | ICD-10-CM | POA: Diagnosis not present

## 2017-08-06 DIAGNOSIS — Z7901 Long term (current) use of anticoagulants: Secondary | ICD-10-CM | POA: Diagnosis not present

## 2017-08-06 DIAGNOSIS — G8911 Acute pain due to trauma: Secondary | ICD-10-CM | POA: Diagnosis not present

## 2017-08-06 DIAGNOSIS — S0083XA Contusion of other part of head, initial encounter: Secondary | ICD-10-CM | POA: Diagnosis not present

## 2017-08-06 DIAGNOSIS — S0993XA Unspecified injury of face, initial encounter: Secondary | ICD-10-CM | POA: Diagnosis not present

## 2017-08-06 DIAGNOSIS — Z043 Encounter for examination and observation following other accident: Secondary | ICD-10-CM | POA: Diagnosis not present

## 2017-08-06 DIAGNOSIS — G319 Degenerative disease of nervous system, unspecified: Secondary | ICD-10-CM | POA: Diagnosis not present

## 2017-08-06 DIAGNOSIS — S0990XA Unspecified injury of head, initial encounter: Secondary | ICD-10-CM | POA: Diagnosis not present

## 2017-08-06 DIAGNOSIS — E1165 Type 2 diabetes mellitus with hyperglycemia: Secondary | ICD-10-CM | POA: Diagnosis not present

## 2017-08-06 DIAGNOSIS — I110 Hypertensive heart disease with heart failure: Secondary | ICD-10-CM | POA: Diagnosis not present

## 2017-08-06 DIAGNOSIS — I6529 Occlusion and stenosis of unspecified carotid artery: Secondary | ICD-10-CM | POA: Diagnosis not present

## 2017-08-06 DIAGNOSIS — I509 Heart failure, unspecified: Secondary | ICD-10-CM | POA: Diagnosis not present

## 2017-08-06 DIAGNOSIS — S199XXA Unspecified injury of neck, initial encounter: Secondary | ICD-10-CM | POA: Diagnosis not present

## 2017-08-06 DIAGNOSIS — I4891 Unspecified atrial fibrillation: Secondary | ICD-10-CM | POA: Diagnosis not present

## 2017-08-17 DIAGNOSIS — E119 Type 2 diabetes mellitus without complications: Secondary | ICD-10-CM | POA: Diagnosis not present

## 2017-08-18 DIAGNOSIS — K219 Gastro-esophageal reflux disease without esophagitis: Secondary | ICD-10-CM | POA: Diagnosis not present

## 2017-08-18 DIAGNOSIS — I4891 Unspecified atrial fibrillation: Secondary | ICD-10-CM | POA: Diagnosis not present

## 2017-08-18 DIAGNOSIS — B372 Candidiasis of skin and nail: Secondary | ICD-10-CM | POA: Diagnosis not present

## 2017-08-28 DIAGNOSIS — Z8673 Personal history of transient ischemic attack (TIA), and cerebral infarction without residual deficits: Secondary | ICD-10-CM | POA: Diagnosis not present

## 2017-08-28 DIAGNOSIS — I5032 Chronic diastolic (congestive) heart failure: Secondary | ICD-10-CM | POA: Diagnosis not present

## 2017-08-28 DIAGNOSIS — I482 Chronic atrial fibrillation: Secondary | ICD-10-CM | POA: Diagnosis not present

## 2017-08-28 DIAGNOSIS — I10 Essential (primary) hypertension: Secondary | ICD-10-CM | POA: Diagnosis not present

## 2017-08-28 DIAGNOSIS — Z515 Encounter for palliative care: Secondary | ICD-10-CM | POA: Diagnosis not present

## 2017-08-30 DIAGNOSIS — R0602 Shortness of breath: Secondary | ICD-10-CM | POA: Diagnosis not present

## 2017-08-30 DIAGNOSIS — I5030 Unspecified diastolic (congestive) heart failure: Secondary | ICD-10-CM | POA: Diagnosis not present

## 2017-09-02 DIAGNOSIS — I4891 Unspecified atrial fibrillation: Secondary | ICD-10-CM | POA: Diagnosis not present

## 2017-09-02 DIAGNOSIS — I509 Heart failure, unspecified: Secondary | ICD-10-CM | POA: Diagnosis not present

## 2017-09-02 DIAGNOSIS — E119 Type 2 diabetes mellitus without complications: Secondary | ICD-10-CM | POA: Diagnosis not present

## 2017-09-04 DIAGNOSIS — R1312 Dysphagia, oropharyngeal phase: Secondary | ICD-10-CM | POA: Diagnosis not present

## 2017-09-07 DIAGNOSIS — R1312 Dysphagia, oropharyngeal phase: Secondary | ICD-10-CM | POA: Diagnosis not present

## 2017-09-08 DIAGNOSIS — R1312 Dysphagia, oropharyngeal phase: Secondary | ICD-10-CM | POA: Diagnosis not present

## 2017-09-09 DIAGNOSIS — R1312 Dysphagia, oropharyngeal phase: Secondary | ICD-10-CM | POA: Diagnosis not present

## 2017-09-10 DIAGNOSIS — R1312 Dysphagia, oropharyngeal phase: Secondary | ICD-10-CM | POA: Diagnosis not present

## 2017-09-14 DIAGNOSIS — R1312 Dysphagia, oropharyngeal phase: Secondary | ICD-10-CM | POA: Diagnosis not present

## 2017-09-15 DIAGNOSIS — R1312 Dysphagia, oropharyngeal phase: Secondary | ICD-10-CM | POA: Diagnosis not present

## 2017-09-16 DIAGNOSIS — R1312 Dysphagia, oropharyngeal phase: Secondary | ICD-10-CM | POA: Diagnosis not present

## 2017-09-17 DIAGNOSIS — R1312 Dysphagia, oropharyngeal phase: Secondary | ICD-10-CM | POA: Diagnosis not present

## 2017-09-18 DIAGNOSIS — R1312 Dysphagia, oropharyngeal phase: Secondary | ICD-10-CM | POA: Diagnosis not present

## 2017-09-21 DIAGNOSIS — I482 Chronic atrial fibrillation: Secondary | ICD-10-CM | POA: Diagnosis not present

## 2017-09-21 DIAGNOSIS — B372 Candidiasis of skin and nail: Secondary | ICD-10-CM | POA: Diagnosis not present

## 2017-09-21 DIAGNOSIS — R1312 Dysphagia, oropharyngeal phase: Secondary | ICD-10-CM | POA: Diagnosis not present

## 2017-09-22 DIAGNOSIS — R1312 Dysphagia, oropharyngeal phase: Secondary | ICD-10-CM | POA: Diagnosis not present

## 2017-09-22 DIAGNOSIS — I4891 Unspecified atrial fibrillation: Secondary | ICD-10-CM | POA: Diagnosis not present

## 2017-09-23 DIAGNOSIS — D649 Anemia, unspecified: Secondary | ICD-10-CM | POA: Diagnosis not present

## 2017-09-23 DIAGNOSIS — R1312 Dysphagia, oropharyngeal phase: Secondary | ICD-10-CM | POA: Diagnosis not present

## 2017-09-23 DIAGNOSIS — Z79899 Other long term (current) drug therapy: Secondary | ICD-10-CM | POA: Diagnosis not present

## 2017-09-24 DIAGNOSIS — D649 Anemia, unspecified: Secondary | ICD-10-CM | POA: Diagnosis not present

## 2017-09-24 DIAGNOSIS — I4891 Unspecified atrial fibrillation: Secondary | ICD-10-CM | POA: Diagnosis not present

## 2017-09-24 DIAGNOSIS — R1312 Dysphagia, oropharyngeal phase: Secondary | ICD-10-CM | POA: Diagnosis not present

## 2017-09-24 DIAGNOSIS — R1310 Dysphagia, unspecified: Secondary | ICD-10-CM | POA: Diagnosis not present

## 2017-09-25 DIAGNOSIS — R1312 Dysphagia, oropharyngeal phase: Secondary | ICD-10-CM | POA: Diagnosis not present

## 2017-10-09 DIAGNOSIS — M6281 Muscle weakness (generalized): Secondary | ICD-10-CM | POA: Diagnosis not present

## 2017-10-09 DIAGNOSIS — E1140 Type 2 diabetes mellitus with diabetic neuropathy, unspecified: Secondary | ICD-10-CM | POA: Diagnosis not present

## 2017-10-09 DIAGNOSIS — I482 Chronic atrial fibrillation: Secondary | ICD-10-CM | POA: Diagnosis not present

## 2017-10-09 DIAGNOSIS — Z7901 Long term (current) use of anticoagulants: Secondary | ICD-10-CM | POA: Diagnosis not present

## 2017-10-15 DIAGNOSIS — R1312 Dysphagia, oropharyngeal phase: Secondary | ICD-10-CM | POA: Diagnosis not present

## 2017-10-16 DIAGNOSIS — R1319 Other dysphagia: Secondary | ICD-10-CM | POA: Diagnosis not present

## 2017-10-16 DIAGNOSIS — R0989 Other specified symptoms and signs involving the circulatory and respiratory systems: Secondary | ICD-10-CM | POA: Diagnosis not present

## 2017-10-16 DIAGNOSIS — J189 Pneumonia, unspecified organism: Secondary | ICD-10-CM | POA: Diagnosis not present

## 2017-10-16 DIAGNOSIS — R1312 Dysphagia, oropharyngeal phase: Secondary | ICD-10-CM | POA: Diagnosis not present

## 2017-10-16 DIAGNOSIS — R488 Other symbolic dysfunctions: Secondary | ICD-10-CM | POA: Diagnosis not present

## 2017-10-16 DIAGNOSIS — R05 Cough: Secondary | ICD-10-CM | POA: Diagnosis not present

## 2017-10-19 DIAGNOSIS — R1312 Dysphagia, oropharyngeal phase: Secondary | ICD-10-CM | POA: Diagnosis not present

## 2017-10-20 DIAGNOSIS — R1312 Dysphagia, oropharyngeal phase: Secondary | ICD-10-CM | POA: Diagnosis not present

## 2017-10-21 DIAGNOSIS — R1312 Dysphagia, oropharyngeal phase: Secondary | ICD-10-CM | POA: Diagnosis not present

## 2017-10-22 DIAGNOSIS — M47892 Other spondylosis, cervical region: Secondary | ICD-10-CM | POA: Diagnosis not present

## 2017-10-22 DIAGNOSIS — E039 Hypothyroidism, unspecified: Secondary | ICD-10-CM | POA: Diagnosis not present

## 2017-10-22 DIAGNOSIS — Z885 Allergy status to narcotic agent status: Secondary | ICD-10-CM | POA: Diagnosis not present

## 2017-10-22 DIAGNOSIS — Z79899 Other long term (current) drug therapy: Secondary | ICD-10-CM | POA: Diagnosis not present

## 2017-10-22 DIAGNOSIS — Z7901 Long term (current) use of anticoagulants: Secondary | ICD-10-CM | POA: Diagnosis not present

## 2017-10-22 DIAGNOSIS — Z882 Allergy status to sulfonamides status: Secondary | ICD-10-CM | POA: Diagnosis not present

## 2017-10-22 DIAGNOSIS — I110 Hypertensive heart disease with heart failure: Secondary | ICD-10-CM | POA: Diagnosis not present

## 2017-10-22 DIAGNOSIS — B9689 Other specified bacterial agents as the cause of diseases classified elsewhere: Secondary | ICD-10-CM | POA: Diagnosis not present

## 2017-10-22 DIAGNOSIS — Z880 Allergy status to penicillin: Secondary | ICD-10-CM | POA: Diagnosis not present

## 2017-10-22 DIAGNOSIS — R269 Unspecified abnormalities of gait and mobility: Secondary | ICD-10-CM | POA: Diagnosis not present

## 2017-10-22 DIAGNOSIS — E785 Hyperlipidemia, unspecified: Secondary | ICD-10-CM | POA: Diagnosis not present

## 2017-10-22 DIAGNOSIS — I509 Heart failure, unspecified: Secondary | ICD-10-CM | POA: Diagnosis not present

## 2017-10-22 DIAGNOSIS — M4312 Spondylolisthesis, cervical region: Secondary | ICD-10-CM | POA: Diagnosis not present

## 2017-10-22 DIAGNOSIS — N390 Urinary tract infection, site not specified: Secondary | ICD-10-CM | POA: Diagnosis not present

## 2017-10-22 DIAGNOSIS — G8911 Acute pain due to trauma: Secondary | ICD-10-CM | POA: Diagnosis not present

## 2017-10-22 DIAGNOSIS — S0093XA Contusion of unspecified part of head, initial encounter: Secondary | ICD-10-CM | POA: Diagnosis not present

## 2017-10-22 DIAGNOSIS — S0003XA Contusion of scalp, initial encounter: Secondary | ICD-10-CM | POA: Diagnosis not present

## 2017-10-22 DIAGNOSIS — Z8673 Personal history of transient ischemic attack (TIA), and cerebral infarction without residual deficits: Secondary | ICD-10-CM | POA: Diagnosis not present

## 2017-10-22 DIAGNOSIS — M5030 Other cervical disc degeneration, unspecified cervical region: Secondary | ICD-10-CM | POA: Diagnosis not present

## 2017-10-22 DIAGNOSIS — I4891 Unspecified atrial fibrillation: Secondary | ICD-10-CM | POA: Diagnosis not present

## 2017-10-22 DIAGNOSIS — J323 Chronic sphenoidal sinusitis: Secondary | ICD-10-CM | POA: Diagnosis not present

## 2017-10-22 DIAGNOSIS — S0990XA Unspecified injury of head, initial encounter: Secondary | ICD-10-CM | POA: Diagnosis not present

## 2017-10-22 DIAGNOSIS — Z791 Long term (current) use of non-steroidal anti-inflammatories (NSAID): Secondary | ICD-10-CM | POA: Diagnosis not present

## 2017-10-22 DIAGNOSIS — Z794 Long term (current) use of insulin: Secondary | ICD-10-CM | POA: Diagnosis not present

## 2017-10-22 DIAGNOSIS — M542 Cervicalgia: Secondary | ICD-10-CM | POA: Diagnosis not present

## 2017-10-23 DIAGNOSIS — Z66 Do not resuscitate: Secondary | ICD-10-CM | POA: Diagnosis not present

## 2017-10-23 DIAGNOSIS — R1312 Dysphagia, oropharyngeal phase: Secondary | ICD-10-CM | POA: Diagnosis not present

## 2017-10-23 DIAGNOSIS — M25551 Pain in right hip: Secondary | ICD-10-CM | POA: Diagnosis not present

## 2017-10-23 DIAGNOSIS — I10 Essential (primary) hypertension: Secondary | ICD-10-CM | POA: Diagnosis not present

## 2017-10-24 DIAGNOSIS — S32591D Other specified fracture of right pubis, subsequent encounter for fracture with routine healing: Secondary | ICD-10-CM | POA: Diagnosis not present

## 2017-10-24 DIAGNOSIS — S12041A Nondisplaced lateral mass fracture of first cervical vertebra, initial encounter for closed fracture: Secondary | ICD-10-CM | POA: Diagnosis not present

## 2017-10-24 DIAGNOSIS — Z881 Allergy status to other antibiotic agents status: Secondary | ICD-10-CM | POA: Diagnosis not present

## 2017-10-24 DIAGNOSIS — I509 Heart failure, unspecified: Secondary | ICD-10-CM | POA: Diagnosis not present

## 2017-10-24 DIAGNOSIS — I5032 Chronic diastolic (congestive) heart failure: Secondary | ICD-10-CM | POA: Diagnosis not present

## 2017-10-24 DIAGNOSIS — Z66 Do not resuscitate: Secondary | ICD-10-CM | POA: Diagnosis not present

## 2017-10-24 DIAGNOSIS — S32020A Wedge compression fracture of second lumbar vertebra, initial encounter for closed fracture: Secondary | ICD-10-CM | POA: Diagnosis not present

## 2017-10-24 DIAGNOSIS — I4891 Unspecified atrial fibrillation: Secondary | ICD-10-CM | POA: Diagnosis not present

## 2017-10-24 DIAGNOSIS — Z885 Allergy status to narcotic agent status: Secondary | ICD-10-CM | POA: Diagnosis not present

## 2017-10-24 DIAGNOSIS — S32599A Other specified fracture of unspecified pubis, initial encounter for closed fracture: Secondary | ICD-10-CM | POA: Diagnosis not present

## 2017-10-24 DIAGNOSIS — Z79899 Other long term (current) drug therapy: Secondary | ICD-10-CM | POA: Diagnosis not present

## 2017-10-24 DIAGNOSIS — S12100A Unspecified displaced fracture of second cervical vertebra, initial encounter for closed fracture: Secondary | ICD-10-CM | POA: Diagnosis not present

## 2017-10-24 DIAGNOSIS — R296 Repeated falls: Secondary | ICD-10-CM | POA: Diagnosis not present

## 2017-10-24 DIAGNOSIS — Z7984 Long term (current) use of oral hypoglycemic drugs: Secondary | ICD-10-CM | POA: Diagnosis not present

## 2017-10-24 DIAGNOSIS — S32019A Unspecified fracture of first lumbar vertebra, initial encounter for closed fracture: Secondary | ICD-10-CM | POA: Diagnosis not present

## 2017-10-24 DIAGNOSIS — E119 Type 2 diabetes mellitus without complications: Secondary | ICD-10-CM | POA: Diagnosis not present

## 2017-10-24 DIAGNOSIS — B372 Candidiasis of skin and nail: Secondary | ICD-10-CM | POA: Diagnosis not present

## 2017-10-24 DIAGNOSIS — S32501A Unspecified fracture of right pubis, initial encounter for closed fracture: Secondary | ICD-10-CM | POA: Diagnosis not present

## 2017-10-24 DIAGNOSIS — G319 Degenerative disease of nervous system, unspecified: Secondary | ICD-10-CM | POA: Diagnosis not present

## 2017-10-24 DIAGNOSIS — S32018A Other fracture of first lumbar vertebra, initial encounter for closed fracture: Secondary | ICD-10-CM | POA: Diagnosis not present

## 2017-10-24 DIAGNOSIS — S32511A Fracture of superior rim of right pubis, initial encounter for closed fracture: Secondary | ICD-10-CM | POA: Diagnosis not present

## 2017-10-24 DIAGNOSIS — K219 Gastro-esophageal reflux disease without esophagitis: Secondary | ICD-10-CM | POA: Diagnosis not present

## 2017-10-24 DIAGNOSIS — Z043 Encounter for examination and observation following other accident: Secondary | ICD-10-CM | POA: Diagnosis not present

## 2017-10-24 DIAGNOSIS — S12112B Nondisplaced Type II dens fracture, initial encounter for open fracture: Secondary | ICD-10-CM | POA: Diagnosis not present

## 2017-10-24 DIAGNOSIS — E039 Hypothyroidism, unspecified: Secondary | ICD-10-CM | POA: Diagnosis not present

## 2017-10-24 DIAGNOSIS — Z9181 History of falling: Secondary | ICD-10-CM | POA: Diagnosis not present

## 2017-10-24 DIAGNOSIS — I10 Essential (primary) hypertension: Secondary | ICD-10-CM | POA: Diagnosis not present

## 2017-10-24 DIAGNOSIS — S12040A Displaced lateral mass fracture of first cervical vertebra, initial encounter for closed fracture: Secondary | ICD-10-CM | POA: Diagnosis not present

## 2017-10-24 DIAGNOSIS — S12040D Displaced lateral mass fracture of first cervical vertebra, subsequent encounter for fracture with routine healing: Secondary | ICD-10-CM | POA: Diagnosis not present

## 2017-10-24 DIAGNOSIS — S32509A Unspecified fracture of unspecified pubis, initial encounter for closed fracture: Secondary | ICD-10-CM | POA: Diagnosis not present

## 2017-10-24 DIAGNOSIS — S32028A Other fracture of second lumbar vertebra, initial encounter for closed fracture: Secondary | ICD-10-CM | POA: Diagnosis not present

## 2017-10-24 DIAGNOSIS — S32591A Other specified fracture of right pubis, initial encounter for closed fracture: Secondary | ICD-10-CM | POA: Diagnosis not present

## 2017-10-24 DIAGNOSIS — S12030A Displaced posterior arch fracture of first cervical vertebra, initial encounter for closed fracture: Secondary | ICD-10-CM | POA: Diagnosis not present

## 2017-10-24 DIAGNOSIS — S12120A Other displaced dens fracture, initial encounter for closed fracture: Secondary | ICD-10-CM | POA: Diagnosis not present

## 2017-10-24 DIAGNOSIS — S12091A Other nondisplaced fracture of first cervical vertebra, initial encounter for closed fracture: Secondary | ICD-10-CM | POA: Diagnosis not present

## 2017-10-24 DIAGNOSIS — I2510 Atherosclerotic heart disease of native coronary artery without angina pectoris: Secondary | ICD-10-CM | POA: Diagnosis not present

## 2017-10-24 DIAGNOSIS — E785 Hyperlipidemia, unspecified: Secondary | ICD-10-CM | POA: Diagnosis not present

## 2017-10-24 DIAGNOSIS — Z880 Allergy status to penicillin: Secondary | ICD-10-CM | POA: Diagnosis not present

## 2017-10-24 DIAGNOSIS — E1140 Type 2 diabetes mellitus with diabetic neuropathy, unspecified: Secondary | ICD-10-CM | POA: Diagnosis not present

## 2017-10-24 DIAGNOSIS — R1310 Dysphagia, unspecified: Secondary | ICD-10-CM | POA: Diagnosis not present

## 2017-10-24 DIAGNOSIS — S32010D Wedge compression fracture of first lumbar vertebra, subsequent encounter for fracture with routine healing: Secondary | ICD-10-CM | POA: Diagnosis not present

## 2017-10-24 DIAGNOSIS — Y998 Other external cause status: Secondary | ICD-10-CM | POA: Diagnosis not present

## 2017-10-24 DIAGNOSIS — S12110D Anterior displaced Type II dens fracture, subsequent encounter for fracture with routine healing: Secondary | ICD-10-CM | POA: Diagnosis not present

## 2017-10-24 DIAGNOSIS — S32029A Unspecified fracture of second lumbar vertebra, initial encounter for closed fracture: Secondary | ICD-10-CM | POA: Diagnosis not present

## 2017-10-24 DIAGNOSIS — S12110A Anterior displaced Type II dens fracture, initial encounter for closed fracture: Secondary | ICD-10-CM | POA: Diagnosis not present

## 2017-10-24 DIAGNOSIS — S79912A Unspecified injury of left hip, initial encounter: Secondary | ICD-10-CM | POA: Diagnosis not present

## 2017-10-24 DIAGNOSIS — R279 Unspecified lack of coordination: Secondary | ICD-10-CM | POA: Diagnosis not present

## 2017-10-24 DIAGNOSIS — R52 Pain, unspecified: Secondary | ICD-10-CM | POA: Diagnosis not present

## 2017-10-24 DIAGNOSIS — S72001A Fracture of unspecified part of neck of right femur, initial encounter for closed fracture: Secondary | ICD-10-CM | POA: Diagnosis not present

## 2017-10-24 DIAGNOSIS — S15102A Unspecified injury of left vertebral artery, initial encounter: Secondary | ICD-10-CM | POA: Diagnosis not present

## 2017-10-24 DIAGNOSIS — Z7901 Long term (current) use of anticoagulants: Secondary | ICD-10-CM | POA: Diagnosis not present

## 2017-10-24 DIAGNOSIS — Z743 Need for continuous supervision: Secondary | ICD-10-CM | POA: Diagnosis not present

## 2017-10-24 DIAGNOSIS — S12101A Unspecified nondisplaced fracture of second cervical vertebra, initial encounter for closed fracture: Secondary | ICD-10-CM | POA: Diagnosis not present

## 2017-10-24 DIAGNOSIS — M542 Cervicalgia: Secondary | ICD-10-CM | POA: Diagnosis not present

## 2017-10-24 DIAGNOSIS — Z7982 Long term (current) use of aspirin: Secondary | ICD-10-CM | POA: Diagnosis not present

## 2017-10-24 DIAGNOSIS — S12090A Other displaced fracture of first cervical vertebra, initial encounter for closed fracture: Secondary | ICD-10-CM | POA: Diagnosis not present

## 2017-10-24 DIAGNOSIS — S0990XA Unspecified injury of head, initial encounter: Secondary | ICD-10-CM | POA: Diagnosis not present

## 2017-10-24 DIAGNOSIS — E876 Hypokalemia: Secondary | ICD-10-CM | POA: Diagnosis not present

## 2017-10-24 DIAGNOSIS — R079 Chest pain, unspecified: Secondary | ICD-10-CM | POA: Diagnosis not present

## 2017-10-24 DIAGNOSIS — I480 Paroxysmal atrial fibrillation: Secondary | ICD-10-CM | POA: Diagnosis not present

## 2017-10-24 DIAGNOSIS — S32050D Wedge compression fracture of fifth lumbar vertebra, subsequent encounter for fracture with routine healing: Secondary | ICD-10-CM | POA: Diagnosis not present

## 2017-10-24 DIAGNOSIS — S79911A Unspecified injury of right hip, initial encounter: Secondary | ICD-10-CM | POA: Diagnosis not present

## 2017-10-24 DIAGNOSIS — W06XXXA Fall from bed, initial encounter: Secondary | ICD-10-CM | POA: Diagnosis not present

## 2017-10-24 DIAGNOSIS — I110 Hypertensive heart disease with heart failure: Secondary | ICD-10-CM | POA: Diagnosis not present

## 2017-10-24 DIAGNOSIS — S12000A Unspecified displaced fracture of first cervical vertebra, initial encounter for closed fracture: Secondary | ICD-10-CM | POA: Diagnosis not present

## 2017-10-24 DIAGNOSIS — Z794 Long term (current) use of insulin: Secondary | ICD-10-CM | POA: Diagnosis not present

## 2017-10-24 DIAGNOSIS — K5900 Constipation, unspecified: Secondary | ICD-10-CM | POA: Diagnosis not present

## 2017-10-24 DIAGNOSIS — S32010A Wedge compression fracture of first lumbar vertebra, initial encounter for closed fracture: Secondary | ICD-10-CM | POA: Diagnosis not present

## 2017-10-24 DIAGNOSIS — M160 Bilateral primary osteoarthritis of hip: Secondary | ICD-10-CM | POA: Diagnosis not present

## 2017-11-02 DIAGNOSIS — B372 Candidiasis of skin and nail: Secondary | ICD-10-CM | POA: Diagnosis not present

## 2017-11-02 DIAGNOSIS — M542 Cervicalgia: Secondary | ICD-10-CM | POA: Diagnosis not present

## 2017-11-02 DIAGNOSIS — Z743 Need for continuous supervision: Secondary | ICD-10-CM | POA: Diagnosis not present

## 2017-11-02 DIAGNOSIS — Z9181 History of falling: Secondary | ICD-10-CM | POA: Diagnosis not present

## 2017-11-02 DIAGNOSIS — S32511D Fracture of superior rim of right pubis, subsequent encounter for fracture with routine healing: Secondary | ICD-10-CM | POA: Diagnosis not present

## 2017-11-02 DIAGNOSIS — K219 Gastro-esophageal reflux disease without esophagitis: Secondary | ICD-10-CM | POA: Diagnosis not present

## 2017-11-02 DIAGNOSIS — S12040D Displaced lateral mass fracture of first cervical vertebra, subsequent encounter for fracture with routine healing: Secondary | ICD-10-CM | POA: Diagnosis not present

## 2017-11-02 DIAGNOSIS — M5030 Other cervical disc degeneration, unspecified cervical region: Secondary | ICD-10-CM | POA: Diagnosis not present

## 2017-11-02 DIAGNOSIS — I509 Heart failure, unspecified: Secondary | ICD-10-CM | POA: Diagnosis not present

## 2017-11-02 DIAGNOSIS — S12120A Other displaced dens fracture, initial encounter for closed fracture: Secondary | ICD-10-CM | POA: Diagnosis not present

## 2017-11-02 DIAGNOSIS — M47892 Other spondylosis, cervical region: Secondary | ICD-10-CM | POA: Diagnosis not present

## 2017-11-02 DIAGNOSIS — R0789 Other chest pain: Secondary | ICD-10-CM | POA: Diagnosis not present

## 2017-11-02 DIAGNOSIS — S12100D Unspecified displaced fracture of second cervical vertebra, subsequent encounter for fracture with routine healing: Secondary | ICD-10-CM | POA: Diagnosis not present

## 2017-11-02 DIAGNOSIS — R5381 Other malaise: Secondary | ICD-10-CM | POA: Diagnosis not present

## 2017-11-02 DIAGNOSIS — M8588 Other specified disorders of bone density and structure, other site: Secondary | ICD-10-CM | POA: Diagnosis not present

## 2017-11-02 DIAGNOSIS — S12110D Anterior displaced Type II dens fracture, subsequent encounter for fracture with routine healing: Secondary | ICD-10-CM | POA: Diagnosis not present

## 2017-11-02 DIAGNOSIS — I444 Left anterior fascicular block: Secondary | ICD-10-CM | POA: Diagnosis not present

## 2017-11-02 DIAGNOSIS — R0689 Other abnormalities of breathing: Secondary | ICD-10-CM | POA: Diagnosis not present

## 2017-11-02 DIAGNOSIS — E876 Hypokalemia: Secondary | ICD-10-CM | POA: Diagnosis not present

## 2017-11-02 DIAGNOSIS — R9431 Abnormal electrocardiogram [ECG] [EKG]: Secondary | ICD-10-CM | POA: Diagnosis not present

## 2017-11-02 DIAGNOSIS — S15102D Unspecified injury of left vertebral artery, subsequent encounter: Secondary | ICD-10-CM | POA: Diagnosis not present

## 2017-11-02 DIAGNOSIS — S32591D Other specified fracture of right pubis, subsequent encounter for fracture with routine healing: Secondary | ICD-10-CM | POA: Diagnosis not present

## 2017-11-02 DIAGNOSIS — S1201XD Stable burst fracture of first cervical vertebra, subsequent encounter for fracture with routine healing: Secondary | ICD-10-CM | POA: Diagnosis not present

## 2017-11-02 DIAGNOSIS — S12091D Other nondisplaced fracture of first cervical vertebra, subsequent encounter for fracture with routine healing: Secondary | ICD-10-CM | POA: Diagnosis not present

## 2017-11-02 DIAGNOSIS — S32010D Wedge compression fracture of first lumbar vertebra, subsequent encounter for fracture with routine healing: Secondary | ICD-10-CM | POA: Diagnosis not present

## 2017-11-02 DIAGNOSIS — R300 Dysuria: Secondary | ICD-10-CM | POA: Diagnosis not present

## 2017-11-02 DIAGNOSIS — S32050D Wedge compression fracture of fifth lumbar vertebra, subsequent encounter for fracture with routine healing: Secondary | ICD-10-CM | POA: Diagnosis not present

## 2017-11-02 DIAGNOSIS — I4891 Unspecified atrial fibrillation: Secondary | ICD-10-CM | POA: Diagnosis not present

## 2017-11-02 DIAGNOSIS — S12090D Other displaced fracture of first cervical vertebra, subsequent encounter for fracture with routine healing: Secondary | ICD-10-CM | POA: Diagnosis not present

## 2017-11-02 DIAGNOSIS — R064 Hyperventilation: Secondary | ICD-10-CM | POA: Diagnosis not present

## 2017-11-02 DIAGNOSIS — E039 Hypothyroidism, unspecified: Secondary | ICD-10-CM | POA: Diagnosis not present

## 2017-11-02 DIAGNOSIS — I5031 Acute diastolic (congestive) heart failure: Secondary | ICD-10-CM | POA: Diagnosis not present

## 2017-11-02 DIAGNOSIS — S12111D Posterior displaced Type II dens fracture, subsequent encounter for fracture with routine healing: Secondary | ICD-10-CM | POA: Diagnosis not present

## 2017-11-02 DIAGNOSIS — W19XXXA Unspecified fall, initial encounter: Secondary | ICD-10-CM | POA: Diagnosis not present

## 2017-11-02 DIAGNOSIS — D649 Anemia, unspecified: Secondary | ICD-10-CM | POA: Diagnosis not present

## 2017-11-02 DIAGNOSIS — S12100A Unspecified displaced fracture of second cervical vertebra, initial encounter for closed fracture: Secondary | ICD-10-CM | POA: Diagnosis not present

## 2017-11-02 DIAGNOSIS — S12000D Unspecified displaced fracture of first cervical vertebra, subsequent encounter for fracture with routine healing: Secondary | ICD-10-CM | POA: Diagnosis not present

## 2017-11-02 DIAGNOSIS — I480 Paroxysmal atrial fibrillation: Secondary | ICD-10-CM | POA: Diagnosis not present

## 2017-11-02 DIAGNOSIS — S32501A Unspecified fracture of right pubis, initial encounter for closed fracture: Secondary | ICD-10-CM | POA: Diagnosis not present

## 2017-11-02 DIAGNOSIS — M4802 Spinal stenosis, cervical region: Secondary | ICD-10-CM | POA: Diagnosis not present

## 2017-11-02 DIAGNOSIS — K5900 Constipation, unspecified: Secondary | ICD-10-CM | POA: Diagnosis not present

## 2017-11-02 DIAGNOSIS — E1140 Type 2 diabetes mellitus with diabetic neuropathy, unspecified: Secondary | ICD-10-CM | POA: Diagnosis not present

## 2017-11-02 DIAGNOSIS — R072 Precordial pain: Secondary | ICD-10-CM | POA: Diagnosis not present

## 2017-11-02 DIAGNOSIS — Z7901 Long term (current) use of anticoagulants: Secondary | ICD-10-CM | POA: Diagnosis not present

## 2017-11-02 DIAGNOSIS — R279 Unspecified lack of coordination: Secondary | ICD-10-CM | POA: Diagnosis not present

## 2017-11-02 DIAGNOSIS — I110 Hypertensive heart disease with heart failure: Secondary | ICD-10-CM | POA: Diagnosis not present

## 2017-11-02 DIAGNOSIS — S32599D Other specified fracture of unspecified pubis, subsequent encounter for fracture with routine healing: Secondary | ICD-10-CM | POA: Diagnosis not present

## 2017-11-02 DIAGNOSIS — J40 Bronchitis, not specified as acute or chronic: Secondary | ICD-10-CM | POA: Diagnosis not present

## 2017-11-02 DIAGNOSIS — S32599A Other specified fracture of unspecified pubis, initial encounter for closed fracture: Secondary | ICD-10-CM | POA: Diagnosis not present

## 2017-11-02 DIAGNOSIS — I6521 Occlusion and stenosis of right carotid artery: Secondary | ICD-10-CM | POA: Diagnosis not present

## 2017-11-02 DIAGNOSIS — S12000A Unspecified displaced fracture of first cervical vertebra, initial encounter for closed fracture: Secondary | ICD-10-CM | POA: Diagnosis not present

## 2017-11-02 DIAGNOSIS — E038 Other specified hypothyroidism: Secondary | ICD-10-CM | POA: Diagnosis not present

## 2017-11-03 DIAGNOSIS — S12000A Unspecified displaced fracture of first cervical vertebra, initial encounter for closed fracture: Secondary | ICD-10-CM | POA: Diagnosis not present

## 2017-11-03 DIAGNOSIS — S12100A Unspecified displaced fracture of second cervical vertebra, initial encounter for closed fracture: Secondary | ICD-10-CM | POA: Diagnosis not present

## 2017-11-03 DIAGNOSIS — S32599A Other specified fracture of unspecified pubis, initial encounter for closed fracture: Secondary | ICD-10-CM | POA: Diagnosis not present

## 2017-11-03 DIAGNOSIS — M542 Cervicalgia: Secondary | ICD-10-CM | POA: Diagnosis not present

## 2017-11-05 DIAGNOSIS — Z7901 Long term (current) use of anticoagulants: Secondary | ICD-10-CM | POA: Diagnosis not present

## 2017-11-05 DIAGNOSIS — S15102D Unspecified injury of left vertebral artery, subsequent encounter: Secondary | ICD-10-CM | POA: Diagnosis not present

## 2017-11-05 DIAGNOSIS — S12111D Posterior displaced Type II dens fracture, subsequent encounter for fracture with routine healing: Secondary | ICD-10-CM | POA: Diagnosis not present

## 2017-11-05 DIAGNOSIS — I6521 Occlusion and stenosis of right carotid artery: Secondary | ICD-10-CM | POA: Diagnosis not present

## 2017-11-05 DIAGNOSIS — S12090D Other displaced fracture of first cervical vertebra, subsequent encounter for fracture with routine healing: Secondary | ICD-10-CM | POA: Diagnosis not present

## 2017-11-06 DIAGNOSIS — R300 Dysuria: Secondary | ICD-10-CM | POA: Diagnosis not present

## 2017-11-06 DIAGNOSIS — J40 Bronchitis, not specified as acute or chronic: Secondary | ICD-10-CM | POA: Diagnosis not present

## 2017-11-10 DIAGNOSIS — S32599D Other specified fracture of unspecified pubis, subsequent encounter for fracture with routine healing: Secondary | ICD-10-CM | POA: Diagnosis not present

## 2017-11-10 DIAGNOSIS — M542 Cervicalgia: Secondary | ICD-10-CM | POA: Diagnosis not present

## 2017-11-10 DIAGNOSIS — S12000D Unspecified displaced fracture of first cervical vertebra, subsequent encounter for fracture with routine healing: Secondary | ICD-10-CM | POA: Diagnosis not present

## 2017-11-10 DIAGNOSIS — S12100D Unspecified displaced fracture of second cervical vertebra, subsequent encounter for fracture with routine healing: Secondary | ICD-10-CM | POA: Diagnosis not present

## 2017-11-11 DIAGNOSIS — M4802 Spinal stenosis, cervical region: Secondary | ICD-10-CM | POA: Diagnosis not present

## 2017-11-11 DIAGNOSIS — M5030 Other cervical disc degeneration, unspecified cervical region: Secondary | ICD-10-CM | POA: Diagnosis not present

## 2017-11-11 DIAGNOSIS — S12000D Unspecified displaced fracture of first cervical vertebra, subsequent encounter for fracture with routine healing: Secondary | ICD-10-CM | POA: Diagnosis not present

## 2017-11-11 DIAGNOSIS — M47892 Other spondylosis, cervical region: Secondary | ICD-10-CM | POA: Diagnosis not present

## 2017-11-11 DIAGNOSIS — M8588 Other specified disorders of bone density and structure, other site: Secondary | ICD-10-CM | POA: Diagnosis not present

## 2017-11-11 DIAGNOSIS — S12110D Anterior displaced Type II dens fracture, subsequent encounter for fracture with routine healing: Secondary | ICD-10-CM | POA: Diagnosis not present

## 2017-11-11 DIAGNOSIS — S1201XD Stable burst fracture of first cervical vertebra, subsequent encounter for fracture with routine healing: Secondary | ICD-10-CM | POA: Diagnosis not present

## 2017-11-24 DIAGNOSIS — S12100D Unspecified displaced fracture of second cervical vertebra, subsequent encounter for fracture with routine healing: Secondary | ICD-10-CM | POA: Diagnosis not present

## 2017-11-24 DIAGNOSIS — S32599D Other specified fracture of unspecified pubis, subsequent encounter for fracture with routine healing: Secondary | ICD-10-CM | POA: Diagnosis not present

## 2017-11-24 DIAGNOSIS — S12000D Unspecified displaced fracture of first cervical vertebra, subsequent encounter for fracture with routine healing: Secondary | ICD-10-CM | POA: Diagnosis not present

## 2017-11-24 DIAGNOSIS — I5031 Acute diastolic (congestive) heart failure: Secondary | ICD-10-CM | POA: Diagnosis not present

## 2017-11-26 DIAGNOSIS — I4891 Unspecified atrial fibrillation: Secondary | ICD-10-CM | POA: Diagnosis not present

## 2017-11-26 DIAGNOSIS — R072 Precordial pain: Secondary | ICD-10-CM | POA: Diagnosis not present

## 2017-11-26 DIAGNOSIS — I444 Left anterior fascicular block: Secondary | ICD-10-CM | POA: Diagnosis not present

## 2017-11-26 DIAGNOSIS — R9431 Abnormal electrocardiogram [ECG] [EKG]: Secondary | ICD-10-CM | POA: Diagnosis not present

## 2017-11-27 DIAGNOSIS — S32511D Fracture of superior rim of right pubis, subsequent encounter for fracture with routine healing: Secondary | ICD-10-CM | POA: Diagnosis not present

## 2017-11-27 DIAGNOSIS — S12000D Unspecified displaced fracture of first cervical vertebra, subsequent encounter for fracture with routine healing: Secondary | ICD-10-CM | POA: Diagnosis not present

## 2017-11-27 DIAGNOSIS — S32591D Other specified fracture of right pubis, subsequent encounter for fracture with routine healing: Secondary | ICD-10-CM | POA: Diagnosis not present

## 2017-11-27 DIAGNOSIS — I5031 Acute diastolic (congestive) heart failure: Secondary | ICD-10-CM | POA: Diagnosis not present

## 2017-11-27 DIAGNOSIS — R0789 Other chest pain: Secondary | ICD-10-CM | POA: Diagnosis not present

## 2017-11-27 DIAGNOSIS — I480 Paroxysmal atrial fibrillation: Secondary | ICD-10-CM | POA: Diagnosis not present

## 2017-12-04 DIAGNOSIS — E038 Other specified hypothyroidism: Secondary | ICD-10-CM | POA: Diagnosis not present

## 2017-12-04 DIAGNOSIS — I5031 Acute diastolic (congestive) heart failure: Secondary | ICD-10-CM | POA: Diagnosis not present

## 2017-12-04 DIAGNOSIS — I480 Paroxysmal atrial fibrillation: Secondary | ICD-10-CM | POA: Diagnosis not present

## 2017-12-11 DIAGNOSIS — S12111D Posterior displaced Type II dens fracture, subsequent encounter for fracture with routine healing: Secondary | ICD-10-CM | POA: Diagnosis not present

## 2017-12-11 DIAGNOSIS — M5030 Other cervical disc degeneration, unspecified cervical region: Secondary | ICD-10-CM | POA: Diagnosis not present

## 2017-12-11 DIAGNOSIS — M8588 Other specified disorders of bone density and structure, other site: Secondary | ICD-10-CM | POA: Diagnosis not present

## 2017-12-11 DIAGNOSIS — S12091D Other nondisplaced fracture of first cervical vertebra, subsequent encounter for fracture with routine healing: Secondary | ICD-10-CM | POA: Diagnosis not present

## 2017-12-17 ENCOUNTER — Other Ambulatory Visit: Payer: Self-pay

## 2017-12-17 NOTE — Patient Outreach (Signed)
[facility] Care Management  12/17/2017  Levie Heritagelice A Zartman 06/27/1924 [id number]   Medication Adherence call to Mrs. Erasmo Scorelice Klindt left a message for patient to call back patient is due [facility] Simvast[facility]  Lillia AbedAna Ollison-Moran CPhT Pharmacy Technician [facility] Care Management Direct Dial [phone]  Fax [phone] [email or web address] [email or web address]

## 2017-12-22 DIAGNOSIS — G301 Alzheimer's disease with late onset: Secondary | ICD-10-CM | POA: Diagnosis not present

## 2017-12-25 DIAGNOSIS — E1122 Type 2 diabetes mellitus with diabetic chronic kidney disease: Secondary | ICD-10-CM | POA: Diagnosis not present

## 2017-12-25 DIAGNOSIS — I5031 Acute diastolic (congestive) heart failure: Secondary | ICD-10-CM | POA: Diagnosis not present

## 2017-12-25 DIAGNOSIS — S12000A Unspecified displaced fracture of first cervical vertebra, initial encounter for closed fracture: Secondary | ICD-10-CM | POA: Diagnosis not present

## 2017-12-29 DIAGNOSIS — I5031 Acute diastolic (congestive) heart failure: Secondary | ICD-10-CM | POA: Diagnosis not present

## 2017-12-29 DIAGNOSIS — E1122 Type 2 diabetes mellitus with diabetic chronic kidney disease: Secondary | ICD-10-CM | POA: Diagnosis not present

## 2017-12-30 DIAGNOSIS — Z9181 History of falling: Secondary | ICD-10-CM | POA: Diagnosis not present

## 2017-12-30 DIAGNOSIS — I4891 Unspecified atrial fibrillation: Secondary | ICD-10-CM | POA: Diagnosis not present

## 2017-12-30 DIAGNOSIS — K219 Gastro-esophageal reflux disease without esophagitis: Secondary | ICD-10-CM | POA: Diagnosis not present

## 2017-12-30 DIAGNOSIS — Z8781 Personal history of (healed) traumatic fracture: Secondary | ICD-10-CM | POA: Diagnosis not present

## 2017-12-30 DIAGNOSIS — M810 Age-related osteoporosis without current pathological fracture: Secondary | ICD-10-CM | POA: Diagnosis not present

## 2017-12-30 DIAGNOSIS — Z780 Asymptomatic menopausal state: Secondary | ICD-10-CM | POA: Diagnosis not present

## 2017-12-30 DIAGNOSIS — S32599A Other specified fracture of unspecified pubis, initial encounter for closed fracture: Secondary | ICD-10-CM | POA: Diagnosis not present

## 2018-01-12 DIAGNOSIS — I5031 Acute diastolic (congestive) heart failure: Secondary | ICD-10-CM | POA: Diagnosis not present

## 2018-01-12 DIAGNOSIS — M542 Cervicalgia: Secondary | ICD-10-CM | POA: Diagnosis not present

## 2018-01-12 DIAGNOSIS — I480 Paroxysmal atrial fibrillation: Secondary | ICD-10-CM | POA: Diagnosis not present

## 2018-01-12 DIAGNOSIS — M47812 Spondylosis without myelopathy or radiculopathy, cervical region: Secondary | ICD-10-CM | POA: Diagnosis not present

## 2018-01-22 DIAGNOSIS — M47812 Spondylosis without myelopathy or radiculopathy, cervical region: Secondary | ICD-10-CM | POA: Diagnosis not present

## 2018-01-22 DIAGNOSIS — B372 Candidiasis of skin and nail: Secondary | ICD-10-CM | POA: Diagnosis not present

## 2018-01-22 DIAGNOSIS — Z789 Other specified health status: Secondary | ICD-10-CM | POA: Diagnosis not present

## 2018-01-26 DIAGNOSIS — R319 Hematuria, unspecified: Secondary | ICD-10-CM | POA: Diagnosis not present

## 2018-01-26 DIAGNOSIS — E039 Hypothyroidism, unspecified: Secondary | ICD-10-CM | POA: Diagnosis not present

## 2018-01-26 DIAGNOSIS — E119 Type 2 diabetes mellitus without complications: Secondary | ICD-10-CM | POA: Diagnosis not present

## 2018-01-26 DIAGNOSIS — M542 Cervicalgia: Secondary | ICD-10-CM | POA: Diagnosis not present

## 2018-01-26 DIAGNOSIS — I2510 Atherosclerotic heart disease of native coronary artery without angina pectoris: Secondary | ICD-10-CM | POA: Diagnosis not present

## 2018-01-26 DIAGNOSIS — Z79899 Other long term (current) drug therapy: Secondary | ICD-10-CM | POA: Diagnosis not present

## 2018-01-26 DIAGNOSIS — I509 Heart failure, unspecified: Secondary | ICD-10-CM | POA: Diagnosis not present

## 2018-01-26 DIAGNOSIS — I5031 Acute diastolic (congestive) heart failure: Secondary | ICD-10-CM | POA: Diagnosis not present

## 2018-01-26 DIAGNOSIS — R300 Dysuria: Secondary | ICD-10-CM | POA: Diagnosis not present

## 2018-01-26 DIAGNOSIS — S12000D Unspecified displaced fracture of first cervical vertebra, subsequent encounter for fracture with routine healing: Secondary | ICD-10-CM | POA: Diagnosis not present

## 2018-01-26 DIAGNOSIS — N390 Urinary tract infection, site not specified: Secondary | ICD-10-CM | POA: Diagnosis not present

## 2019-06-09 IMAGING — DX DG RIBS W/ CHEST 3+V BILAT
5 series · 5 of 5 positions shown · non-contrast
Comparison: Chest radiograph June 27, 2012

CLINICAL DATA: Pain following recent fall

EXAM:
BILATERAL RIBS AND CHEST - 4+ VIEW

[chest pa]
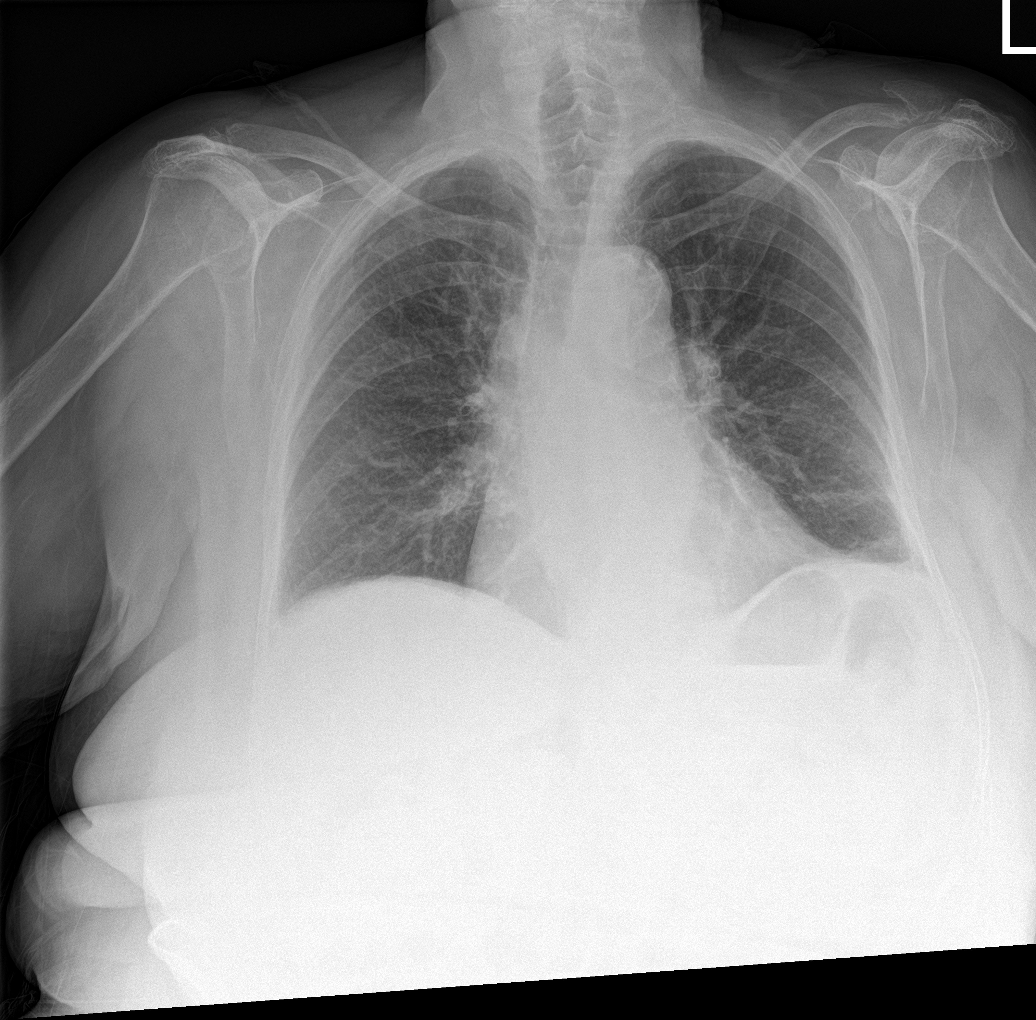

[rib pa obl (1 of 2)]
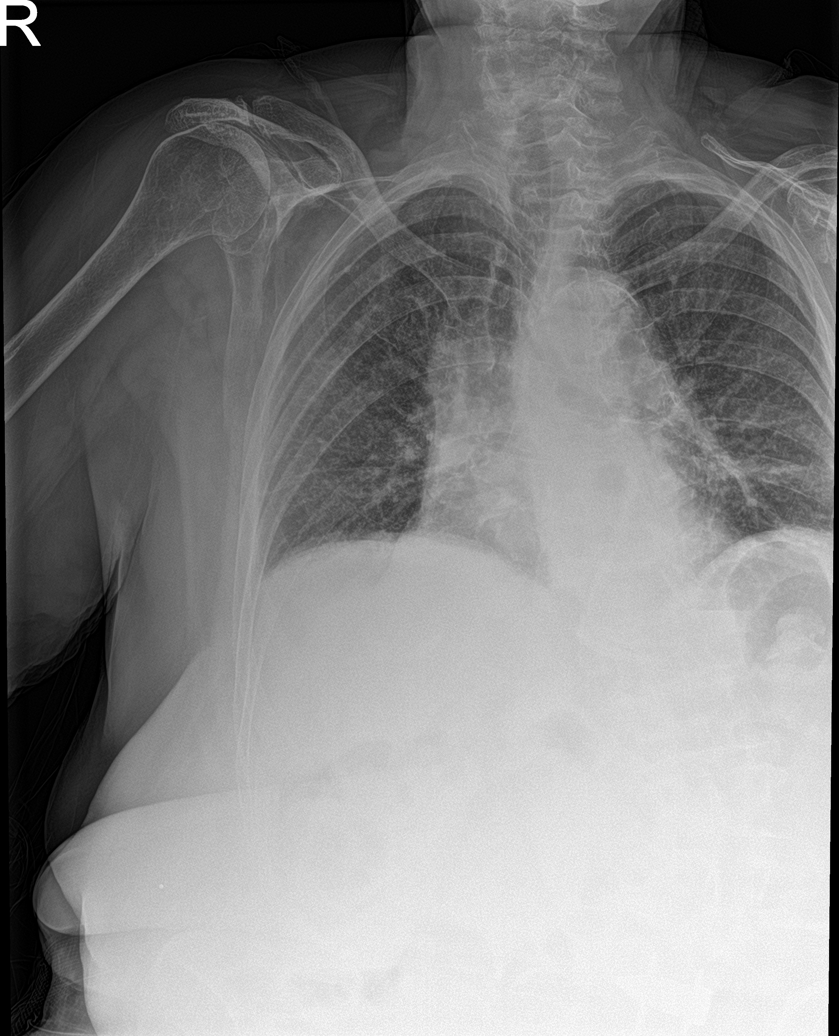

[rib ap (1 of 2)]
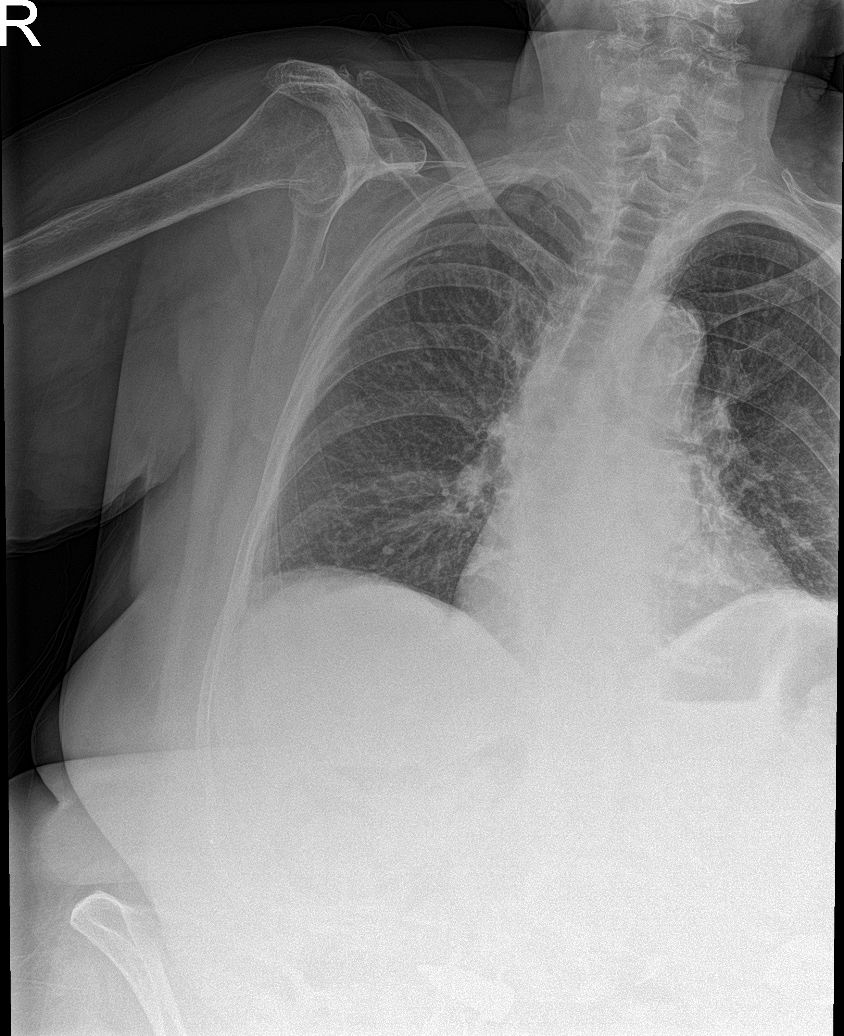

[rib ap (2 of 2)]
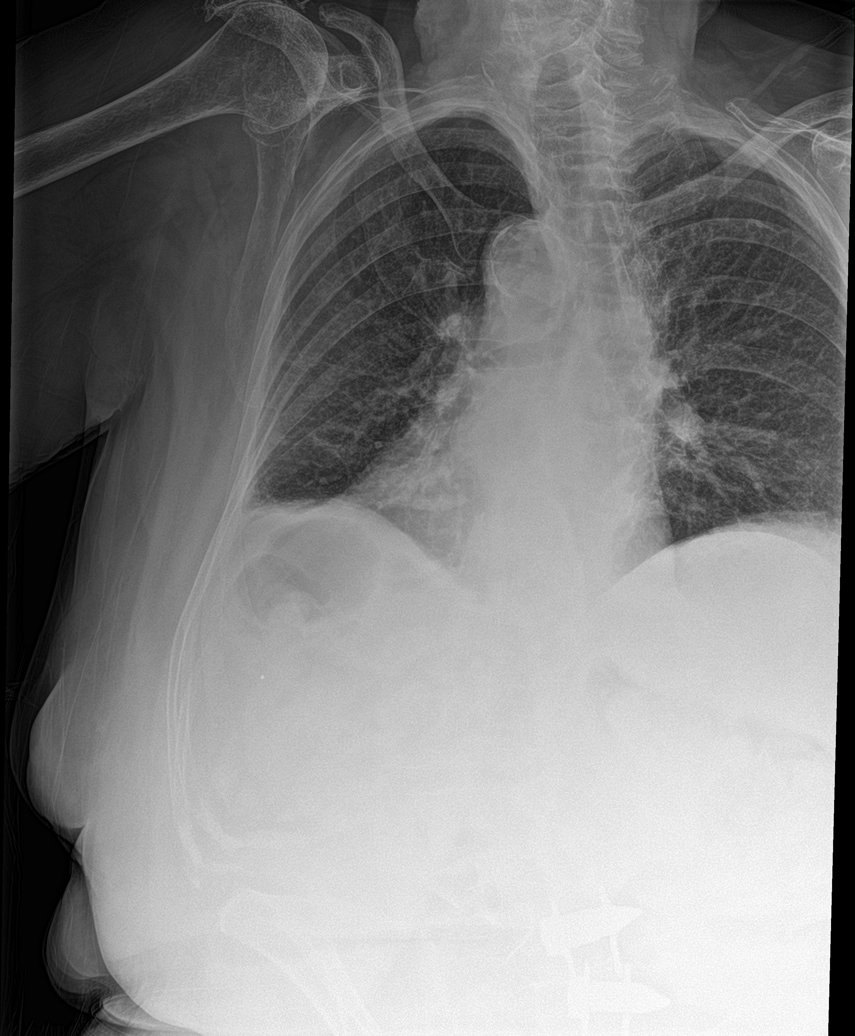

[rib pa obl (2 of 2)]
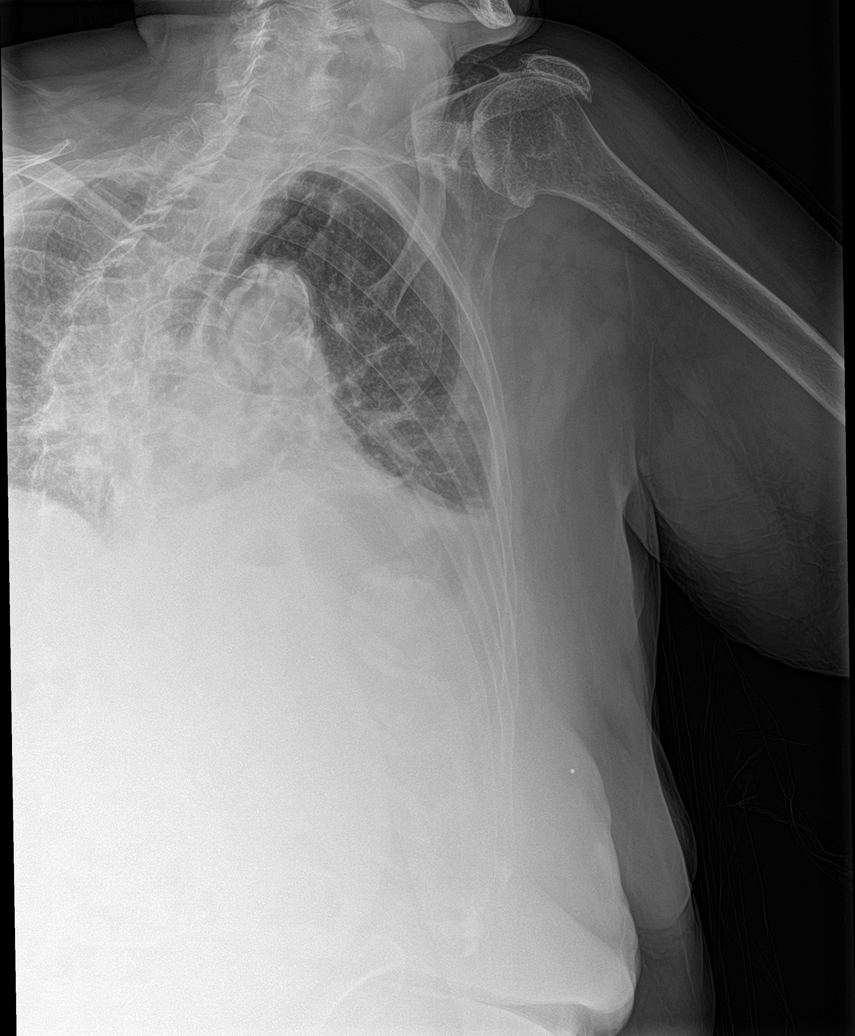

[5 of 5 positions shown; findings below may reference images not displayed]

FINDINGS: Frontal chest as well as oblique and cone-down rib images
bilaterally obtained. There is left base atelectasis. There is no
lung edema or consolidation. Heart is upper normal in size with
pulmonary vascularity within normal limits. There is aortic
atherosclerosis. No adenopathy.

Bones are osteoporotic. There is degenerative change in each
shoulder, chronic and stable in appearance.

There is no evident pneumothorax or pleural effusion. No appreciable
rib fracture.
IMPRESSION: No appreciable rib fracture. No pneumothorax evident. Bones
osteoporotic. Left base atelectasis. No edema or consolidation.
There is aortic atherosclerosis.

Aortic Atherosclerosis ([id number]).

## 2021-10-29 DEATH — deceased
